# Patient Record
Sex: Male | Born: 1970
Health system: Southern US, Community
[De-identification: ages and names within clinical notes are randomized; demographics above are authoritative.]

## PROBLEM LIST (undated history)

## (undated) DIAGNOSIS — F419 Anxiety disorder, unspecified: Secondary | ICD-10-CM

## (undated) DIAGNOSIS — N261 Atrophy of kidney (terminal): Secondary | ICD-10-CM

## (undated) HISTORY — DX: Anxiety disorder, unspecified: F41.9

## (undated) HISTORY — DX: Atrophy of kidney (terminal): N26.1

---

## 1990-04-12 HISTORY — PX: TONSILECTOMY, ADENOIDECTOMY, BILATERAL MYRINGOTOMY AND TUBES: SHX2538

## 1990-04-12 HISTORY — PX: TONSILLECTOMY: SUR1361

## 2007-09-19 ENCOUNTER — Ambulatory Visit: Payer: Self-pay | Admitting: Family Medicine

## 2007-09-19 DIAGNOSIS — R030 Elevated blood-pressure reading, without diagnosis of hypertension: Secondary | ICD-10-CM

## 2007-09-20 ENCOUNTER — Encounter: Payer: Self-pay | Admitting: Family Medicine

## 2007-09-20 LAB — CONVERTED CEMR LAB
Albumin: 4.8 g/dL (ref 3.5–5.2)
Alkaline Phosphatase: 95 units/L (ref 39–117)
CO2: 24 meq/L (ref 19–32)
Calcium: 9.4 mg/dL (ref 8.4–10.5)
Chloride: 103 meq/L (ref 96–112)
Cholesterol: 246 mg/dL — ABNORMAL HIGH (ref 0–200)
Glucose, Bld: 81 mg/dL (ref 70–99)
Potassium: 4.8 meq/L (ref 3.5–5.3)
Sodium: 139 meq/L (ref 135–145)
Total Protein: 7.3 g/dL (ref 6.0–8.3)
Triglycerides: 661 mg/dL — ABNORMAL HIGH (ref <150)

## 2007-12-21 ENCOUNTER — Ambulatory Visit: Payer: Self-pay | Admitting: Family Medicine

## 2007-12-21 DIAGNOSIS — E781 Pure hyperglyceridemia: Secondary | ICD-10-CM

## 2007-12-21 DIAGNOSIS — E7849 Other hyperlipidemia: Secondary | ICD-10-CM | POA: Insufficient documentation

## 2007-12-26 ENCOUNTER — Telehealth (INDEPENDENT_AMBULATORY_CARE_PROVIDER_SITE_OTHER): Payer: Self-pay | Admitting: *Deleted

## 2007-12-26 ENCOUNTER — Encounter: Payer: Self-pay | Admitting: Family Medicine

## 2007-12-26 LAB — CONVERTED CEMR LAB
Cholesterol: 296 mg/dL — ABNORMAL HIGH (ref 0–200)
Triglycerides: 534 mg/dL — ABNORMAL HIGH (ref <150)

## 2009-12-04 ENCOUNTER — Ambulatory Visit: Payer: Self-pay | Admitting: Family Medicine

## 2009-12-04 DIAGNOSIS — F329 Major depressive disorder, single episode, unspecified: Secondary | ICD-10-CM | POA: Insufficient documentation

## 2009-12-04 DIAGNOSIS — G4700 Insomnia, unspecified: Secondary | ICD-10-CM

## 2009-12-17 ENCOUNTER — Ambulatory Visit (HOSPITAL_COMMUNITY): Payer: Self-pay | Admitting: Psychology

## 2009-12-25 ENCOUNTER — Ambulatory Visit: Payer: Self-pay | Admitting: Family Medicine

## 2010-01-07 ENCOUNTER — Ambulatory Visit (HOSPITAL_COMMUNITY): Payer: Self-pay | Admitting: Psychology

## 2010-01-27 ENCOUNTER — Ambulatory Visit (HOSPITAL_COMMUNITY): Payer: Self-pay | Admitting: Psychology

## 2010-02-02 ENCOUNTER — Ambulatory Visit (HOSPITAL_COMMUNITY): Payer: Self-pay | Admitting: Psychology

## 2010-02-09 ENCOUNTER — Ambulatory Visit (HOSPITAL_COMMUNITY): Payer: Self-pay | Admitting: Psychology

## 2010-02-23 ENCOUNTER — Ambulatory Visit (HOSPITAL_COMMUNITY): Payer: Self-pay | Admitting: Psychology

## 2010-03-11 ENCOUNTER — Encounter: Payer: Self-pay | Admitting: Family Medicine

## 2010-04-02 ENCOUNTER — Ambulatory Visit: Payer: Self-pay | Admitting: Family Medicine

## 2010-04-02 DIAGNOSIS — C44319 Basal cell carcinoma of skin of other parts of face: Secondary | ICD-10-CM | POA: Insufficient documentation

## 2010-04-08 ENCOUNTER — Ambulatory Visit: Payer: Self-pay | Admitting: Family Medicine

## 2010-04-08 ENCOUNTER — Telehealth: Payer: Self-pay | Admitting: Family Medicine

## 2010-04-30 ENCOUNTER — Telehealth: Payer: Self-pay | Admitting: Family Medicine

## 2010-05-08 ENCOUNTER — Telehealth: Payer: Self-pay | Admitting: Family Medicine

## 2010-05-12 NOTE — Assessment & Plan Note (Signed)
Summary: anxiety, insomnia   Vital Signs:  Patient profile:   40 year old male Height:      71.2 inches Weight:      187 pounds BMI:     26.03 Pulse rate:   104 / minute BP sitting:   155 / 88  (left arm) Cuff size:   regular  Vitals Entered By: Avon Gully CMA, Duncan Dull) (December 04, 2009 1:42 PM) CC: anxiety,pt has had trouble sleeping at night, under a lot of stress several situations in his life causing him stress   Primary Care Dmoni Fortson:  Nani Gasser MD  CC:  anxiety, pt has had trouble sleeping at night, and under a lot of stress several situations in his life causing him stress.  History of Present Illness: anxiety,pt has had trouble sleeping at night, under a lot of stress several situations in his life causing him stress.  Has notices has been shaking. His son is in jail and his wife if Bipolar.  He works night shift and hasn't been sleeping well.  Only gets about 4 hours of sleep at night. He worries at night when tries to go to sleep.  Has never felt this anxious.  Never taken medications for his mood. No suicidal thoughts.   Left thumb pain when he hits is mildy.  Painful when pulls the thumb back or pushes down on it. No swlling. only painful if bumps it.  No alleviating sxs.  No redness. No prior injuries.  Can do all his regular activities without pain.    Current Medications (verified): 1)  None  Allergies (verified): 1)  ! * Pollen  Comments:  Nurse/Medical Assistant: The patient's medications and allergies were reviewed with the patient and were updated in the Medication and Allergy Lists. Avon Gully CMA, Duncan Dull) (December 04, 2009 1:46 PM)  Physical Exam  General:  Well-developed,well-nourished,in no acute distress; alert,appropriate and cooperative throughout examination Msk:  Left thum w/ NROM, strength 5/5 in all direction. Nontender and no joint swelling or redness.    Impression & Recommendations:  Problem # 1:  GENERALIZED ANXIETY  DISORDER (ICD-300.02) GAD-7 score is 12 (Moderate) PHQ-9 score is 11 (moderate) Dsicussed tx with therapy and Medications. He was open to both. Will refer to [facility] and start and SSRI. Discussed risk, benefits,ad npotential SE including inc risk of suicide. Pt to call if ay concernes. F/U in 3 weeks to repeat questionnaires adn adjust med.  His updated medication list for this problem includes:    Citalopram Hydrobromide 20 Mg Tabs (Citalopram hydrobromide) .Marland Kitchen... 1/2 tab by mouth daily for one week, then increase to whole tab daily  Orders: Psychology Referral (Psychology)  Problem # 2:  INSOMNIA (ICD-780.52) Trial fo SSRI first, as I do think this is directly related to his mood. F/U in 3 weeks. If still having insomnia can discussed pharmcologi and non pharm tx.  Complete Medication List: 1)  Citalopram Hydrobromide 20 Mg Tabs (Citalopram hydrobromide) .... 1/2 tab by mouth daily for one week, then increase to whole tab daily  Patient Instructions: 1)  Will start citalopram  daily.  2)  Please schedule a follow-up appointment in 3 weeks for anxiety and sleep.  3)  We will make a referral to [facility].  Prescriptions: CITALOPRAM HYDROBROMIDE 20 MG TABS (CITALOPRAM HYDROBROMIDE) 1/2 tab by mouth daily for one week, then increase to whole tab daily  #30 x 0   Entered and Authorized by:   Nani Gasser MD   Signed by:  Nani Gasser MD on 12/04/2009   Method used:   Electronically to        [facility]* (retail)       [address] [place], [place]  [address]       Ph: [phone]       Fax: [phone]   RxID:   [id number]

## 2010-05-12 NOTE — Assessment & Plan Note (Signed)
Summary: 3 WEEK FU Anxiety and Insomnia   Vital Signs:  Patient profile:   40 year old male Height:      71.2 inches Weight:      186 pounds Pulse rate:   85 / minute BP sitting:   143 / 89  (right arm) Cuff size:   regular  Vitals Entered By: Avon Gully CMA, Duncan Dull) (December 25, 2009 9:32 AM) CC: f/u mood, med not helping at all, felt worse, could not sleep on meds   Primary Care Provider:  Nani Gasser MD  CC:  f/u mood, med not helping at all, felt worse, and could not sleep on meds.  History of Present Illness: f/u mood, med not helping at all, felt worse, could not sleep on meds  anxiety,pt has had trouble sleeping at night, under a lot of stress several situations in his life causing him stress.  Has notices has been shaking. His son is in jail and his wife if Bipolar.  He works night shift and hasn't been sleeping well.  Only gets about 4 hours of sleep at night. He worries at night when tries to go to sleep.  Has never felt this anxious. Had tried lorazepam after the death of a family member.   No suicidal thoughts.   Left thumb pain when he hits is mildy.  Painful when pulls the thumb back or pushes down on it. No swlling. only painful if bumps it.  No alleviating sxs.  No redness. No prior injuries.  Can do all his regular activities without pain.    Current Medications (verified): 1)  Citalopram Hydrobromide 20 Mg Tabs (Citalopram Hydrobromide) .... 1/2 Tab By Mouth Daily For One Week, Then Increase To Whole Tab Daily  Allergies (verified): 1)  ! * Pollen   Impression & Recommendations:  Problem # 1:  GENERALIZED ANXIETY DISORDER (ICD-300.02) Did see Olegario Messier last week and thought it was helpful. HAs a f/u appt next week. Encouraged him ot f/u. GAD-7 12 adn PHQ-9 score is 13.   I am dissapointed the citalopram didn't help. Will change to zoloft and have him f/u in one month.  His updated medication list for this problem includes:    Sertraline Hcl 50 Mg  Tabs (Sertraline hcl) ..... Start with 1/2 a tab daily then increase whole pill. taking in am.  Problem # 2:  INSOMNIA (ICD-780.52) Works 3rd shift. Will start with trial of trazdone. Start wth half a tab and can increase up to 2 tabs if needed. Discussed other sleep meds and thier potential SE of sedation and grogginess. Needs to make sure gets a full 8 hours of sleep. If he doesn't notice improvement in the next 2 weeks can call and I will change to [place]. Dsicussed potential for dependence with that medication.   Complete Medication List: 1)  Sertraline Hcl 50 Mg Tabs (Sertraline hcl) .... Start with 1/2 a tab daily then increase whole pill. takin in am. 2)  Trazodone Hcl 50 Mg Tabs (Trazodone hcl) .... Take 1-2 pills at bedtime for insomnia   Patient Instructions: 1)  Please schedule a follow-up appointment in 1 month for mood and insomnia.  Prescriptions: TRAZODONE HCL 50 MG TABS (TRAZODONE HCL) Take 1-2 pills at bedtime for insomnia  #60 x 0   Entered and Authorized by:   Nani Gasser MD   Signed by:   Nani Gasser MD on 12/25/2009   Method used:   Electronically to        [facility] (retail)       [address] [place], [place]  [address]       Ph: [phone]       Fax: [phone]   RxID:   [id number] SERTRALINE HCL 50 MG TABS (SERTRALINE HCL) Start with 1/2 a tab daily then increase whole pill. Takin in AM.  #30 x 0   Entered and Authorized by:   Nani Gasser MD   Signed by:   Nani Gasser MD on 12/25/2009   Method used:   Electronically to        [facility]* (retail)       [address] [place], [place]  [address]       Ph: [phone]       Fax: [phone]   RxID:   [id number]

## 2010-05-12 NOTE — Progress Notes (Signed)
Summary: [name]  [name]   Imported By: Sherian Rein 01/02/2010 12:18:10  _____________________________________________________________________  External Attachment:    Type:   Image     Comment:   External Document

## 2010-05-12 NOTE — Letter (Signed)
Summary: Depression & Anxiety Questionnaires  Depression & Anxiety Questionnaires   Imported By: Lanelle Bal 12/23/2009 10:31:22  _____________________________________________________________________  External Attachment:    Type:   Image     Comment:   External Document

## 2010-05-12 NOTE — Progress Notes (Signed)
Summary: GAD-7/[facility] Kathryne Sharper  GAD-7/ Kathryne Sharper   Imported By: Sherian Rein 01/02/2010 12:19:14  _____________________________________________________________________  External Attachment:    Type:   Image     Comment:   External Document

## 2010-05-14 ENCOUNTER — Ambulatory Visit (INDEPENDENT_AMBULATORY_CARE_PROVIDER_SITE_OTHER): Payer: BC Managed Care – PPO | Admitting: Family Medicine

## 2010-05-14 ENCOUNTER — Encounter: Payer: Self-pay | Admitting: Family Medicine

## 2010-05-14 DIAGNOSIS — R599 Enlarged lymph nodes, unspecified: Secondary | ICD-10-CM | POA: Insufficient documentation

## 2010-05-14 NOTE — Medication Information (Signed)
Summary: Nonadherence with Sertraline & Citalopram/[facility]  Nonadherence with Sertraline & Citalopram/[facility]   Imported By: Lanelle Bal 04/08/2010 12:48:08  _____________________________________________________________________  External Attachment:    Type:   Image     Comment:   External Document

## 2010-05-14 NOTE — Progress Notes (Signed)
Summary: NEED PHARMACY CHANGED  Phone Note Call from Patient   Caller: Spouse Summary of Call: PT'S SPOUSE REQ TO HAVE PHARMACY CHANGED TO [facility] IN [place] INSTEAD OF [facility] Initial call taken by: Janeal Holmes,  April 08, 2010 10:31 AM  Follow-up for Phone Call        done already Follow-up by: Avon Gully CMA, Duncan Dull),  April 08, 2010 11:01 AM

## 2010-05-14 NOTE — Assessment & Plan Note (Signed)
Summary: atypical nevus   Vital Signs:  Patient profile:   40 year old male Height:      71.2 inches Weight:      182 pounds BMI:     25.33 O2 Sat:      94 % on Room air Temp:     98.3 degrees F oral Pulse rate:   118 / minute BP sitting:   134 / 84  (left arm) Cuff size:   regular  Vitals Entered By: Payton Spark CMA (April 02, 2010 10:12 AM)  O2 Flow:  Room air CC: Scab on R side of nose x months. Worried about skin cancer   Primary Care Provider:  Nani Gasser MD  CC:  Scab on R side of nose x months. Worried about skin cancer.  History of Present Illness: 40 yo WM presents for a worrisome spot on R side of nose present  ~3 mos.  It has scabbed and fallen off but come back.  Not bleeding or oozing.  Does not hurt.    Concerned b/c dad had skin cancer. Has a lot of facial sun exposure from fishing.  Current Medications (verified): 1)  None  Allergies (verified): 1)  ! * Pollen  Physical Exam  Skin:  0.3 cm round raised papular lesion R side of nose with tiny scab on top   Impression & Recommendations:  Problem # 1:  NEVUS, ATYPICAL (ICD-216.9) Reid will return next week for a shave excision of this skin lesion that could be a cancer or precancer given the fact that it is a 'non healing' scab x 3 mos now in a  highly sun exposed are + fam hx of skin cancer.  He is agreeable to come back for this.   Orders Added: 1)  Est. Patient Level II [04540]

## 2010-05-14 NOTE — Progress Notes (Signed)
Summary: Derm referral  Phone Note Call from Patient Call back at Home Phone [phone] Call back at cell [phone]   Caller: wife-Crystal Call For: Nani Gasser MD Reason for Call: Referral Summary of Call: PC from pt's wife requesting name of dermatologist other than [facility].  Because pt missed appt they will not reschedule him.  I called their office to see if they would reschedule as pt may not have gotten our message, but their office is closed.  Do you have another preference? Initial call taken by: Francee Piccolo CMA Duncan Dull),  May 08, 2010 1:54 PM  Follow-up for Phone Call        NOt sure off the top of my head. Can try [facility] or [facility]. They may even want to check with their insurance to see who is in network.  Follow-up by: Nani Gasser MD,  May 08, 2010 2:48 PM  Additional Follow-up for Phone Call Additional follow up Details #1::        RC to pt's wife.  she states they do not want to go to [facility].  Pt's wife will make appt in [place].  She will get recommendation form her doctor's office.  Advised Crystal to call our office with appt time and date and location and we can forward notes/labs to hthat physician.  She is agreeable.  I will flag myself to follow up on this on 1/31. Additional Follow-up by: Francee Piccolo CMA Duncan Dull),  May 08, 2010 5:48 PM

## 2010-05-14 NOTE — Progress Notes (Signed)
Summary: Pathology results  Phone Note Call from Patient Call back at [phone]   Caller: Patient Reason for Call: Lab or Test Results Summary of Call: Pt is requesting results of pathology from 04/08/10.  Pt states on voicemail that he has never been notified of these results. Initial call taken by: Francee Piccolo CMA Duncan Dull),  April 30, 2010 1:43 PM  Follow-up for Phone Call        Chart says he was called on 12/30 and we scheduled hime to see Derm on 04-16-2010. Maybe we don't have the correct number.  Follow-up by: Nani Gasser MD,  April 30, 2010 1:52 PM  Additional Follow-up for Phone Call Additional follow up Details #1::        called and spoke to the wife. wife states they were out of town and perhaps their daughter erased message if a message was left on the vm. Notified wife of results and gave them the number to [facility] which is where referral was sent Additional Follow-up by: Avon Gully CMA, Duncan Dull),  April 30, 2010 1:59 PM

## 2010-05-14 NOTE — Assessment & Plan Note (Signed)
Summary: bx of lesion on nose   Vital Signs:  Patient profile:   40 year old male Height:      71.2 inches Weight:      184 pounds Pulse rate:   81 / minute BP sitting:   147 / 92  (right arm) Cuff size:   regular  Vitals Entered By: Avon Gully CMA, Duncan Dull) (April 08, 2010 10:35 AM) CC: bx on the nose and back,lesion on the back has changed colors   Primary Care Kumari Sculley:  Nani Gasser MD  CC:  bx on the nose and back and lesion on the back has changed colors.  History of Present Illness: bx on the nose and back,lesion on the back has changed colors.  I reviewed prior note from Dr. B. Lesion  x 3 months and keeps scabbing over. No drianage or redness. Doesn't ever wear sunscreen. Also a lesion on his badk that has changed colors. Neither are tender or painful.   Current Medications (verified): 1)  None  Allergies (verified): 1)  ! * Pollen  Comments:  Nurse/Medical Assistant: The patient's medications and allergies were reviewed with the patient and were updated in the Medication and Allergy Lists. Avon Gully CMA, Duncan Dull) (April 08, 2010 10:36 AM)  Physical Exam  General:  Well-developed,well-nourished,in no acute distress; alert,appropriate and cooperative throughout examination Skin:  Skin tag appears normal on right upper back Does have a small lesion on right side of nose with pinpoint scab. No erythema.     Impression & Recommendations:  Problem # 1:  NEVUS, ATYPICAL (ICD-216.9)  Area biopsied and pt tolerated well. Will call with pathology report in one week. Based on exam I am most suspicious of basal cell cancer.   Orders: Shave Skin Lesion < 0.5cm face/ears/eyelids/nose/lips/mm (11310)  Patient Instructions: 1)  We will call you with the biopsy results in about a week.    Orders Added: 1)  Est. Patient Level II [30865] 2)  Shave Skin Lesion < 0.5cm face/ears/eyelids/nose/lips/mm [11310]    Procedure Note Last Tetanus:  Historical (04/13/2003)  Biopsy: Biopsy Type: Skin Onset of lesion: 2 months Indication: suspicious lesion  Procedure # 1: shave biopsy    Size (in cm): 0.2 x 0.2    Region: right side of nose    Anesthesia: 1% lidocaine w/o epinephrine  Cleaned and prepped with: alcohol and betadine Wound dressing: bandaid

## 2010-05-19 LAB — CONVERTED CEMR LAB
Basophils Relative: 2 % — ABNORMAL HIGH (ref 0–1)
Eosinophils Absolute: 0.2 10*3/uL (ref 0.0–0.7)
MCHC: 33.7 g/dL (ref 30.0–36.0)
MCV: 89 fL (ref 78.0–100.0)
Monocytes Absolute: 0.2 10*3/uL (ref 0.1–1.0)
Monocytes Relative: 2 % — ABNORMAL LOW (ref 3–12)
Neutrophils Relative %: 17 % — ABNORMAL LOW (ref 43–77)
RBC: 5.01 M/uL (ref 4.22–5.81)

## 2010-05-20 NOTE — Assessment & Plan Note (Signed)
Summary: ? Swollen Lymphnode   Vital Signs:  Patient profile:   40 year old male Height:      71.2 inches Weight:      182 pounds Temp:     98.4 degrees F oral Pulse rate:   84 / minute BP sitting:   137 / 84  (right arm) Cuff size:   regular  Vitals Entered By: Avon Gully CMA, (AAMA) (May 14, 2010 10:00 AM) CC: swollen lymphnode on the left neck since thurs   Primary Care Provider:  Nani Gasser MD  CC:  swollen lymphnode on the left neck since thurs.  History of Present Illness: Says felt like the flu biut without th ecold sxs. Just had aches and fatigue for several days and low grade temp. NO cough, ST, ear pain, or sinus sxs.  Wife and daugher had URI sxs. Wife had strep.  He denies ST or drainge.  Hx of tonsillectomy. Now feels better overall but LN is still swollen. Denies any swollen glands in teh axilla or the groin.   Current Medications (verified): 1)  None  Allergies (verified): 1)  ! * Pollen  Comments:  Nurse/Medical Assistant: The patient's medications and allergies were reviewed with the patient and were updated in the Medication and Allergy Lists. Avon Gully CMA, Duncan Dull) (May 14, 2010 10:01 AM)  Physical Exam  General:  Well-developed,well-nourished,in no acute distress; alert,appropriate and cooperative throughout examination Head:  Normocephalic and atraumatic without obvious abnormalities. No apparent alopecia or balding. Eyes:  No corneal or conjunctival inflammation noted. EOMI. Perrla. Ears:  External ear exam shows no significant lesions or deformities.  Otoscopic examination reveals clear canals, tympanic membranes are intact bilaterally without bulging, retraction, inflammation or discharge. Hearing is grossly normal bilaterally. Nose:  External nasal examination shows no deformity or inflammation. Nasal mucosa are pink and moist without lesions or exudates. Mouth:  Oral mucosa and oropharynx without lesions or exudates.   Teeth in good repair. Neck:  Right anteriori neck with 1.5 to 2 cm lymph nodes that is very tender. No erythema. It is not rock hard.  Lungs:  Normal respiratory effort, chest expands symmetrically. Lungs are clear to auscultation, no crackles or wheezes. Heart:  Normal rate and regular rhythm. S1 and S2 normal without gallop, murmur, click, rub or other extra sounds.   Impression & Recommendations:  Problem # 1:  CERVICAL LYMPHADENOPATHY (ICD-785.6) Assessment New Will getCBC but likely viral illness. Call if LN doesn't resolve in 3 weeks. Avoid rubbing it.  Orders: T-CBC w/Diff [phone])   Orders Added: 1)  T-CBC w/Diff [[id number]] 2)  Est. Patient Level III [30160]

## 2010-06-08 ENCOUNTER — Telehealth: Payer: Self-pay | Admitting: Family Medicine

## 2010-06-10 ENCOUNTER — Encounter: Payer: Self-pay | Admitting: Family Medicine

## 2010-06-10 ENCOUNTER — Ambulatory Visit (INDEPENDENT_AMBULATORY_CARE_PROVIDER_SITE_OTHER): Payer: BC Managed Care – PPO | Admitting: Family Medicine

## 2010-06-10 LAB — CONVERTED CEMR LAB
Basophils Absolute: 0.1 10*3/uL (ref 0.0–0.1)
Basophils Relative: 1 % (ref 0–1)
MCHC: 35.4 g/dL (ref 30.0–36.0)
Neutro Abs: 5.1 10*3/uL (ref 1.7–7.7)
Neutrophils Relative %: 61 % (ref 43–77)
Platelets: 189 10*3/uL (ref 150–400)
RBC: 4.95 M/uL (ref 4.22–5.81)
RDW: 11.8 % (ref 11.5–15.5)

## 2010-06-12 LAB — CONVERTED CEMR LAB
CMV IgM: 0.09 (ref <0.90)
EBV VCA IgG: 1.63 — ABNORMAL HIGH

## 2010-06-18 NOTE — Assessment & Plan Note (Signed)
Summary: F/U enlarged LN.    Vital Signs:  Patient profile:   40 year old male Height:      71.2 inches Weight:      185 pounds Pulse rate:   96 / minute BP sitting:   145 / 85  (right arm) Cuff size:   regular  Vitals Entered By: Avon Gully CMA, Duncan Dull) (June 10, 2010 8:13 AM) CC: f/u Bld work and recheck lymphnodes   Primary Care Provider:  Nani Gasser MD  CC:  f/u Bld work and recheck lymphnodes.  History of Present Illness: f/u Bld work and recheck lymphnodes. Feel the LN is still swollen.  Says occ will feel a little smaller and then will get bigger agian.  Has had a couple of episodes of brief nausea. Lasted a cuople of minutes. No vomiting.  Was recently exposed to mono.  Does feel very fatigued. No fever, aches or chills but did have this initally. Has had enlarged node for about 7 weeks now.   Current Medications (verified): 1)  None  Allergies (verified): 1)  ! * Pollen  Comments:  Nurse/Medical Assistant: The patient's medications and allergies were reviewed with the patient and were updated in the Medication and Allergy Lists. Avon Gully CMA, Duncan Dull) (June 10, 2010 8:14 AM)  Physical Exam  General:  Well-developed,well-nourished,in no acute distress; alert,appropriate and cooperative throughout examination Lungs:  Normal respiratory effort, chest expands symmetrically. Lungs are clear to auscultation, no crackles or wheezes. Heart:  Normal rate and regular rhythm. S1 and S2 normal without gallop, murmur, click, rub or other extra sounds. Cervical Nodes:  Approx 1 x 2cm LN right ant cerv. NOn tender and no erythema.  Left ant cerv ls palpable but much smaller.    Impression & Recommendations:  Problem # 1:  CERVICAL LYMPHADENOPATHY (ICD-785.6) Discussed will recheck labs. Will also check for CMV and EBV wince had a positive exposure. If all normal then will refer fo possible r bx with general surgery.  Orders: T-CBC w/Diff  [phone]) T-CMV IgM  Antibody ([id number]) T-Epstein Barr Virus Antibody Panel I ([id number]) T-Epstein-Barr virus, viral capsid ([id number])  Patient Instructions: 1)  WE WILL CALL YOU WITH YOUR LAB RESULTS.  2)  iF LABS ARE NORMAL WE WILL REFER YOU TO SURGERY FOR POSSIBLE BIOPSY OF THE LYMPH NODE   Orders Added: 1)  T-CBC w/Diff [[id number]] 2)  T-CMV IgM  Antibody [[id number]] 3)  T-Epstein Barr Virus Antibody Panel I [[id number]] 4)  T-Epstein-Barr virus, viral capsid [id number]

## 2010-06-18 NOTE — Progress Notes (Signed)
Summary: [facility]-Lab work  Phone Note Call from Patient Call back at [phone]   Caller: Wife-Crystal Summary of Call: Wife left message stating pt needs order faxed to lab for CBC.  I left Crystal a message to return call--pt needs labs and ROV. Initial call taken by: Francee Piccolo CMA Duncan Dull),  June 08, 2010 1:34 PM  Follow-up for Phone Call        Spoke to wife regarding the above.  She is agreeable with appt.  Pt will come on Wednesday at 8am.  Pt will have labs at that time as they want to discuss exposure to mono around time symptoms began. Follow-up by: Francee Piccolo CMA Duncan Dull),  June 08, 2010 2:15 PM

## 2010-09-09 ENCOUNTER — Encounter: Payer: Self-pay | Admitting: Family Medicine

## 2010-09-09 ENCOUNTER — Ambulatory Visit (INDEPENDENT_AMBULATORY_CARE_PROVIDER_SITE_OTHER): Payer: BC Managed Care – PPO | Admitting: Family Medicine

## 2010-09-09 VITALS — BP 147/92 | HR 51 | Ht 71.0 in | Wt 185.0 lb

## 2010-09-09 DIAGNOSIS — F411 Generalized anxiety disorder: Secondary | ICD-10-CM

## 2010-09-09 DIAGNOSIS — C44319 Basal cell carcinoma of skin of other parts of face: Secondary | ICD-10-CM

## 2010-09-09 DIAGNOSIS — C44311 Basal cell carcinoma of skin of nose: Secondary | ICD-10-CM

## 2010-09-09 MED ORDER — FLUOXETINE HCL 20 MG PO CAPS: 20.0000 mg | ORAL_CAPSULE | Freq: Every day | ORAL | Status: DC

## 2010-09-09 NOTE — Assessment & Plan Note (Signed)
Will refer to derma gain. Really needs to be treated.

## 2010-09-09 NOTE — Progress Notes (Signed)
  Subjective:    Patient ID: Anthony Morgan, male    DOB: [birth year], 40 y.o.   MRN: [id number]  HPI He is separating from his wife.  He is having difficulty feeling very nervous.  Having a hard time falling asleep but can stay asleep.  Feels it is affecting him daily. Occ feels sad and depressed.  Has been married for 19 years.  Has a duaghter who is 14.  He moved out last weekend. He took one of his moms lorazepam and that helped. Tried citalopram in the past and that worsened his mood and sleep. Was seeing kathy at the time.  Started the sertraline but then had nasal congestion so stopped it.    Missed his appt with derm to have his basal cell treated.  Now the derm office won't see him.  Needs new referral to new office.    Review of Systems     Objective:   Physical Exam  Constitutional: He is oriented to person, place, and time. He appears well-developed and well-nourished.  HENT:  Head: Normocephalic and atraumatic.  Neurological: He is alert and oriented to person, place, and time.  Psychiatric: He has a normal mood and affect. His behavior is normal.          Assessment & Plan:

## 2010-09-09 NOTE — Assessment & Plan Note (Addendum)
I discussed that I really think he would benefit from counseling in the past.  He saw Olegario Messier last year and felt it was helpful. Will start prozac, F/U in 3 weeks. If still not sleeping consider low dose xanax for prn use.  PHQ-9 score or 9, GAD-7 score of 9.

## 2010-09-14 ENCOUNTER — Encounter: Payer: Self-pay | Admitting: *Deleted

## 2010-09-14 ENCOUNTER — Encounter: Payer: Self-pay | Admitting: Family Medicine

## 2010-09-14 ENCOUNTER — Ambulatory Visit (INDEPENDENT_AMBULATORY_CARE_PROVIDER_SITE_OTHER): Payer: BC Managed Care – PPO | Admitting: Family Medicine

## 2010-09-14 VITALS — BP 146/97 | HR 70 | Wt 184.0 lb

## 2010-09-14 DIAGNOSIS — F411 Generalized anxiety disorder: Secondary | ICD-10-CM

## 2010-09-14 MED ORDER — MUPIROCIN 2 % EX OINT: TOPICAL_OINTMENT | Freq: Three times a day (TID) | CUTANEOUS | Status: AC

## 2010-09-14 MED ORDER — SERTRALINE HCL 50 MG PO TABS: 50.0000 mg | ORAL_TABLET | Freq: Every day | ORAL | Status: DC

## 2010-09-14 NOTE — Progress Notes (Signed)
  Subjective:    Patient ID: Anthony Morgan, male    DOB: [birth year], 40 y.o.   MRN: [id number]  HPI Sat after ate felt like ne hedles where sticking in his stomach about 5-6 hours after took it. Then next night had abdominal pain and cramping and diarrhea.  Started it about 1 weeks ago.  He hasn't taken any today.    Tick bite on his right flank. He had to detach it. Now has a rash around the area. It itched for about 2 weeks. They have been itchy. Has been putting hydrocortisone on it.  No fever or arhtralgias. Has 2 lesion on his right side, one on the left side and one on each foot.  No drainage or fever.    Review of Systems     Objective:   Physical Exam  Constitutional: He is oriented to person, place, and time. He appears well-developed and well-nourished.  HENT:  Head: Normocephalic and atraumatic.  Cardiovascular: Normal rate, regular rhythm, normal heart sounds and intact distal pulses.   Pulmonary/Chest: Effort normal and breath sounds normal.  Abdominal: Soft. Bowel sounds are normal. He exhibits no distension and no mass. There is no tenderness. There is no rebound and no guarding.  Neurological: He is alert and oriented to person, place, and time.  Skin: Skin is warm and dry.  Psychiatric: He has a normal mood and affect.          Assessment & Plan:

## 2010-09-14 NOTE — Assessment & Plan Note (Signed)
Will try the sertraline again.  I am not certain the fluoxetine really cause the GI upset but it is listed as a SE. Will change. Call if not dong well.  Consider Viibryd with the coupon card if feels he gets nasal congestion again.  F/U in 3-4 weeks.

## 2011-03-12 ENCOUNTER — Ambulatory Visit (INDEPENDENT_AMBULATORY_CARE_PROVIDER_SITE_OTHER): Payer: BC Managed Care – PPO | Admitting: Family Medicine

## 2011-03-12 DIAGNOSIS — R509 Fever, unspecified: Secondary | ICD-10-CM

## 2011-03-12 DIAGNOSIS — Z23 Encounter for immunization: Secondary | ICD-10-CM

## 2011-03-12 DIAGNOSIS — J329 Chronic sinusitis, unspecified: Secondary | ICD-10-CM

## 2011-03-12 LAB — POCT INFLUENZA A/B: Influenza B, POC: NEGATIVE

## 2011-03-12 MED ORDER — AMOXICILLIN-POT CLAVULANATE 875-125 MG PO TABS: 1.0000 | ORAL_TABLET | Freq: Two times a day (BID) | ORAL | Status: AC

## 2011-03-12 NOTE — Progress Notes (Signed)
  Subjective:    Patient ID: Anthony Morgan, male    DOB: January 31, 1971, 40 y.o.   MRN: [id number]  HPI 4 days ago started with head congestion and sneezing. Then yesterday felt a little better and then went to bed and woke up with fever and chills. Fever to 100.?  No GI sxs.  Mild scratchy throat. No ear pain or pressure. + sick contact last week.  Woke up with a lot fo sinus pressure and swelling on the left side.    Review of Systems     Objective:   Physical Exam  Constitutional: He is oriented to person, place, and time. He appears well-developed and well-nourished.  HENT:  Head: Normocephalic and atraumatic.  Right Ear: External ear normal.  Left Ear: External ear normal.  Nose: Nose normal.  Mouth/Throat: Oropharynx is clear and moist.       TMs and canals are clear. Mild swelling over the left maxillary sinus. No erythema.   Eyes: Conjunctivae and EOM are normal. Pupils are equal, round, and reactive to light.  Neck: Neck supple. No thyromegaly present.  Cardiovascular: Normal rate and normal heart sounds.   Pulmonary/Chest: Effort normal and breath sounds normal.  Lymphadenopathy:    He has no cervical adenopathy.  Neurological: He is alert and oriented to person, place, and time.  Skin: Skin is warm and dry.  Psychiatric: He has a normal mood and affect.          Assessment & Plan:  Sinusitis on the left maxillary - Flu test is neg.  Fever 3 days after illness started with left facial pain and swelling. Will tx with augementin. Need not for work. Work on hydration.  Call if not better in one week.   Flu vaccine given today.

## 2011-03-12 NOTE — Patient Instructions (Signed)
Call if not better in one week. Sinusitis Sinuses are air pockets within the bones of your face. The growth of bacteria within a sinus leads to infection. The infection prevents the sinuses from draining. This infection is called sinusitis. SYMPTOMS   There will be different areas of pain depending on which sinuses have become infected.  The maxillary sinuses often produce pain beneath the eyes.     Frontal sinusitis may cause pain in the middle of the forehead and above the eyes.  Other problems (symptoms) include:  Toothaches.     Colored, pus-like (purulent) drainage from the nose.     Swelling, warmth, and tenderness over the sinus areas may be signs of infection.  TREATMENT   Sinusitis is most often determined by an exam.X-rays may be taken. If x-rays have been taken, make sure you obtain your results or find out how you are to obtain them. Your caregiver may give you medications (antibiotics). These are medications that will help kill the bacteria causing the infection. You may also be given a medication (decongestant) that helps to reduce sinus swelling.   HOME CARE INSTRUCTIONS    Only take over-the-counter or prescription medicines for pain, discomfort, or fever as directed by your caregiver.     Drink extra fluids. Fluids help thin the mucus so your sinuses can drain more easily.     Applying either moist heat or ice packs to the sinus areas may help relieve discomfort.     Use saline nasal sprays to help moisten your sinuses. The sprays can be found at your local drugstore.  SEEK IMMEDIATE MEDICAL CARE IF:  You have a fever.     You have increasing pain, severe headaches, or toothache.     You have nausea, vomiting, or drowsiness.     You develop unusual swelling around the face or trouble seeing.  MAKE SURE YOU:    Understand these instructions.     Will watch your condition.     Will get help right away if you are not doing well or get worse.  Document Released:  03/29/2005 Document Revised: 12/09/2010 Document Reviewed: 10/26/2006 [facility] Patient Information 2012 [place], [place].

## 2011-09-01 ENCOUNTER — Encounter: Payer: Self-pay | Admitting: Family Medicine

## 2011-09-01 ENCOUNTER — Ambulatory Visit (INDEPENDENT_AMBULATORY_CARE_PROVIDER_SITE_OTHER): Payer: BC Managed Care – PPO | Admitting: Family Medicine

## 2011-09-01 ENCOUNTER — Other Ambulatory Visit: Payer: Self-pay | Admitting: *Deleted

## 2011-09-01 ENCOUNTER — Telehealth: Payer: Self-pay | Admitting: *Deleted

## 2011-09-01 VITALS — BP 133/77 | HR 74 | Ht 71.2 in | Wt 186.0 lb

## 2011-09-01 DIAGNOSIS — F329 Major depressive disorder, single episode, unspecified: Secondary | ICD-10-CM

## 2011-09-01 DIAGNOSIS — F411 Generalized anxiety disorder: Secondary | ICD-10-CM

## 2011-09-01 DIAGNOSIS — F419 Anxiety disorder, unspecified: Secondary | ICD-10-CM

## 2011-09-01 MED ORDER — SERTRALINE HCL 50 MG PO TABS: ORAL_TABLET | ORAL | Status: DC

## 2011-09-01 MED ORDER — ALPRAZOLAM 0.25 MG PO TABS: 0.2500 mg | ORAL_TABLET | Freq: Every evening | ORAL | Status: DC | PRN

## 2011-09-01 NOTE — Telephone Encounter (Signed)
Pt is requesting an rx to help him sleep.

## 2011-09-01 NOTE — Telephone Encounter (Signed)
Will send over xanax. Use sparingly as can become dependent on it.

## 2011-09-01 NOTE — Patient Instructions (Signed)
We will call you with the referral. 

## 2011-09-01 NOTE — Telephone Encounter (Signed)
Called rx for xanx into pharm as shown in the medication log because it wasn't signed and pt wanted to pick up today

## 2011-09-01 NOTE — Telephone Encounter (Signed)
LMOM
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
## 2011-09-03 ENCOUNTER — Encounter (HOSPITAL_COMMUNITY): Payer: Self-pay | Admitting: Psychiatry

## 2011-09-03 ENCOUNTER — Ambulatory Visit (INDEPENDENT_AMBULATORY_CARE_PROVIDER_SITE_OTHER): Payer: BC Managed Care – PPO | Admitting: Psychiatry

## 2011-09-03 VITALS — BP 112/80 | HR 75 | Ht 70.5 in | Wt 193.0 lb

## 2011-09-03 DIAGNOSIS — F411 Generalized anxiety disorder: Secondary | ICD-10-CM

## 2011-09-03 MED ORDER — SERTRALINE HCL 50 MG PO TABS: ORAL_TABLET | ORAL | Status: DC

## 2011-09-03 MED ORDER — BUSPIRONE HCL 5 MG PO TABS: 5.0000 mg | ORAL_TABLET | Freq: Three times a day (TID) | ORAL | Status: DC | PRN
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
## 2011-09-24 ENCOUNTER — Encounter (HOSPITAL_COMMUNITY): Payer: Self-pay | Admitting: Psychiatry

## 2011-09-24 ENCOUNTER — Ambulatory Visit (INDEPENDENT_AMBULATORY_CARE_PROVIDER_SITE_OTHER): Payer: BC Managed Care – PPO | Admitting: Psychiatry

## 2011-09-24 VITALS — BP 135/95 | HR 115 | Ht 70.5 in | Wt 182.0 lb

## 2011-09-24 DIAGNOSIS — F411 Generalized anxiety disorder: Secondary | ICD-10-CM

## 2011-09-24 DIAGNOSIS — F329 Major depressive disorder, single episode, unspecified: Secondary | ICD-10-CM

## 2011-09-24 MED ORDER — SERTRALINE HCL 100 MG PO TABS: 100.0000 mg | ORAL_TABLET | Freq: Every day | ORAL | Status: DC

## 2011-09-24 MED ORDER — SERTRALINE HCL 100 MG PO TABS: 150.0000 mg | ORAL_TABLET | Freq: Every day | ORAL | Status: DC

## 2011-09-24 MED ORDER — SERTRALINE HCL 100 MG PO TABS
100.0000 mg | ORAL_TABLET | Freq: Every day | ORAL | Status: DC
Start: 2011-09-24 — End: 2011-09-24

## 2011-09-24 MED ORDER — BUSPIRONE HCL 5 MG PO TABS
ORAL_TABLET | ORAL | Status: DC
Start: 2011-09-24 — End: 2011-11-22
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
## 2011-09-28 ENCOUNTER — Encounter (HOSPITAL_COMMUNITY): Payer: Self-pay | Admitting: Psychiatry

## 2011-10-08 ENCOUNTER — Telehealth (HOSPITAL_COMMUNITY): Payer: Self-pay

## 2011-10-08 DIAGNOSIS — G4700 Insomnia, unspecified: Secondary | ICD-10-CM

## 2011-10-08 NOTE — Telephone Encounter (Signed)
Called patient back, left message stating I would call him back on Monday, 10/11/2011.

## 2011-10-11 MED ORDER — RAMELTEON 8 MG PO TABS: 8.0000 mg | ORAL_TABLET | Freq: Every day | ORAL | Status: DC

## 2011-10-11 NOTE — Telephone Encounter (Signed)
The patient reports that he still have difficulty with sleep. He states that he continue to have symptoms of anxiety and depression.  He reports that his insomnia continues to be a problem, sleeping 3-6 hours. The patient reports tbat  PLAN: Will start Rozerem 8 mg

## 2011-10-11 NOTE — Telephone Encounter (Signed)
Called patient. Informed patient I would call again to discuss his sleep issues.

## 2011-10-11 NOTE — Addendum Note (Signed)
Addended by: Larena Sox on: 10/11/2011 01:09 PM   Modules accepted: Orders

## 2011-10-12 ENCOUNTER — Ambulatory Visit (HOSPITAL_COMMUNITY): Payer: Self-pay | Admitting: Psychiatry

## 2011-10-18 NOTE — Telephone Encounter (Signed)
Called patient as he reported he could not sleep. Left a message asking patient to call back if he has any further questions.

## 2011-10-21 ENCOUNTER — Ambulatory Visit (HOSPITAL_COMMUNITY): Payer: Self-pay | Admitting: Psychiatry

## 2011-10-25 ENCOUNTER — Ambulatory Visit (HOSPITAL_COMMUNITY): Payer: Self-pay | Admitting: Psychology

## 2011-11-22 ENCOUNTER — Encounter (HOSPITAL_COMMUNITY): Payer: Self-pay | Admitting: Psychology

## 2011-11-22 ENCOUNTER — Ambulatory Visit (INDEPENDENT_AMBULATORY_CARE_PROVIDER_SITE_OTHER): Payer: BC Managed Care – PPO | Admitting: Psychology

## 2011-11-22 DIAGNOSIS — F329 Major depressive disorder, single episode, unspecified: Secondary | ICD-10-CM

## 2011-11-22 NOTE — Patient Instructions (Addendum)
1- Think about goals you would like to address in therapy and come prepared to discuss at next visit.
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
## 2011-12-06 ENCOUNTER — Ambulatory Visit (HOSPITAL_COMMUNITY): Payer: Self-pay | Admitting: Psychology

## 2011-12-07 ENCOUNTER — Ambulatory Visit (INDEPENDENT_AMBULATORY_CARE_PROVIDER_SITE_OTHER): Payer: BC Managed Care – PPO | Admitting: Psychology

## 2011-12-07 DIAGNOSIS — F411 Generalized anxiety disorder: Secondary | ICD-10-CM

## 2011-12-07 DIAGNOSIS — F329 Major depressive disorder, single episode, unspecified: Secondary | ICD-10-CM

## 2011-12-07 NOTE — Progress Notes (Signed)
   THERAPIST PROGRESS NOTE  Session Time: 807- 850 am  Participation Level: Active  Behavioral Response: NeatAlertEuthymic  Type of Therapy: Individual Therapy  Treatment Goals addressed: Anxiety and Coping  Interventions: Solution Focused, Strength-based, Psychosocial Skills: communication skills, recognizing people that he will permit in his life, coping and Supportive  Summary: Anthony Morgan is a 41 y.o. male who presents as pleasant and easily engaged.  The patient reports he is doing well and that he thinks he is adapting to the separation and not seeing his daughter as often.  He missed the daily contact with his daughter as they are very close but he is much happier being out of his marriage.  The patient has no regrets about leaving his marriage and reports he tried for almost 20 years before finally having enough and leaving.  His 20th anniversary is later this week and he denies having any sadness linked with not achieving this milestone.  He knows that his daughter still wishes that her parents were together but he knows that leaving was best.  The time he has with his daughter now is much more enjoyable.  When they all lived under one roof, a lot of time with the patient was spent talking about how unhappy she was at the way her mother treated her father whom she adores.  The patient notices that he is much more open to talking about his feelings now that he is living with his parents.  He uses his mother as a Science writer and has a few good friends that he relies.  His work life is good and he is looking forward to when the one year date of separation comes so that he can seek a divorce.  He is not yet ready for a serious relationship but is irritated this his estranged wife has not yet signed the separation agreement and they have been apart since May 4th.  He did reconnect with a girl he dated in high school and they went out for dinner and he did enjoy catching up with her.  He  expects that when he does start dating that telling his (adoptive) son will be no issue.  He will tell his daughter at some point but he suspects that it might be a bigger issue for her.  I asked the patient to consider the characteristics he desires in a person that he would permit in his life.  He had a few ideas but I requested that he continue thinking about this and return with more information at next visit  Suicidal/Homicidal: No  Plan: Return again in 2 weeks.  Diagnosis: Axis I: Major depressive disorder; GAD    Axis II: No diagnosis    Salley Scarlet, [facility] 12/07/2011

## 2011-12-07 NOTE — Patient Instructions (Addendum)
1- Continue developing list of characteristics that you WANT and DON'T WANT in a person that you would allow in your life.  Some of the things you already know you WANT are someone who is: honest, faithful, selfless, believes in God/is spiritual, puts husband first, loves their children and can love mine as well, good foundation (not abusive environment or has dealt with their issues)   DON'T WANT: someone with anger issues, someone who lies, someone who lies, someone who doesn't manipulate or lie to gain sympathy.  2- Continue taking care of your emotional self.  3- Use distraction as a way to get your mind off of the stress of the separation and not seeing your daughter as much: fishing, take a ride, talks to his mother or another trusted person., concentrate on what you are doing and dont' let your mind wander.

## 2011-12-09 ENCOUNTER — Encounter (HOSPITAL_COMMUNITY): Payer: Self-pay | Admitting: Psychology

## 2011-12-09 DIAGNOSIS — F411 Generalized anxiety disorder: Secondary | ICD-10-CM | POA: Insufficient documentation

## 2011-12-17 ENCOUNTER — Ambulatory Visit (INDEPENDENT_AMBULATORY_CARE_PROVIDER_SITE_OTHER): Payer: BC Managed Care – PPO | Admitting: Family Medicine

## 2011-12-17 ENCOUNTER — Encounter: Payer: Self-pay | Admitting: Family Medicine

## 2011-12-17 VITALS — BP 121/78 | HR 89 | Temp 97.9°F | Resp 18 | Wt 180.0 lb

## 2011-12-17 DIAGNOSIS — J329 Chronic sinusitis, unspecified: Secondary | ICD-10-CM

## 2011-12-17 DIAGNOSIS — A499 Bacterial infection, unspecified: Secondary | ICD-10-CM

## 2011-12-17 MED ORDER — AMOXICILLIN-POT CLAVULANATE 875-125 MG PO TABS: 1.0000 | ORAL_TABLET | Freq: Two times a day (BID) | ORAL | Status: DC

## 2011-12-17 NOTE — Patient Instructions (Signed)

## 2011-12-17 NOTE — Progress Notes (Signed)
CC: Anthony Morgan is a 41 y.o. male is here for Facial Swelling and Nasal Congestion   Subjective: HPI:  1 a half weeks of facial pressure located just below the eyes that has now progressed to between the eyes. Associated with thick nasal discharge green color without blood. Interventions include Mucinex D without much relief. Facial discomfort is worse when standing up quickly or leaning forward. He denies fevers, chills, neck pain, motor or sensory disturbances, cough, shortness of breath, chest pain.   Review Of Systems Outlined In HPI  Past Medical History  Diagnosis Date  . Anxiety      Family History  Problem Relation Age of Onset  . Hypertension Father   . Hyperlipidemia Father   . Stroke      grandmother  . Cancer      grandfather/bladder ca  . Cancer Mother     breast  . Cancer Maternal Grandmother      History  Substance Use Topics  . Smoking status: Current Everyday Smoker -- 1.0 packs/day for 25 years    Types: Cigarettes  . Smokeless tobacco: Not on file  . Alcohol Use: No     Objective: Filed Vitals:   12/17/11 0920  BP: 121/78  Pulse: 89  Temp: 97.9 F (36.6 C)  Resp: 18    General: Alert and Oriented, No Acute Distress HEENT: Pupils equal, round, reactive to light. Conjunctivae clear.  External ears unremarkable, canals clear with intact TMs with appropriate landmarks.  Middle ear appears open without effusion. Erythematous inferior turbinates with copious purulent mucus.  Moist mucous membranes, pharynx without inflammation nor lesions.  Neck supple without palpable lymphadenopathy nor abnormal masses. Maxillary sinus tenderness bilaterally. Lungs: Clear to auscultation bilaterally, no wheezing/ronchi/rales.  Comfortable work of breathing. Good air movement. Cardiac: Regular rate and rhythm. Normal S1/S2.  No murmurs, rubs, nor gallops.   Skin: Warm and dry.  Assessment & Plan: Anthony Morgan was seen today for facial swelling and nasal  congestion.  Diagnoses and associated orders for this visit:  Bacterial sinusitis - amoxicillin-clavulanate (AUGMENTIN) 875-125 MG per tablet; Take 1 tablet by mouth 2 (two) times daily.  Start Augmentin, symptomatically control with nasal saline washes, Mucinex D, ibuprofen worse he mentioned for discomfort. Call clinic if no better on Monday. Signs and symptoms requring emergent/urgent reevaluation were discussed with the patient.    Return if symptoms worsen or fail to improve.  Requested Prescriptions   Signed Prescriptions Disp Refills  . amoxicillin-clavulanate (AUGMENTIN) 875-125 MG per tablet 20 tablet 0    Sig: Take 1 tablet by mouth 2 (two) times daily.

## 2011-12-21 ENCOUNTER — Encounter (HOSPITAL_COMMUNITY): Payer: Self-pay | Admitting: Psychology

## 2011-12-21 ENCOUNTER — Ambulatory Visit (INDEPENDENT_AMBULATORY_CARE_PROVIDER_SITE_OTHER): Payer: BC Managed Care – PPO | Admitting: Psychology

## 2011-12-21 DIAGNOSIS — F411 Generalized anxiety disorder: Secondary | ICD-10-CM

## 2011-12-21 DIAGNOSIS — F329 Major depressive disorder, single episode, unspecified: Secondary | ICD-10-CM
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
## 2011-12-24 ENCOUNTER — Ambulatory Visit (INDEPENDENT_AMBULATORY_CARE_PROVIDER_SITE_OTHER): Payer: BC Managed Care – PPO | Admitting: Family Medicine

## 2011-12-24 ENCOUNTER — Encounter: Payer: Self-pay | Admitting: Family Medicine

## 2011-12-24 ENCOUNTER — Ambulatory Visit (INDEPENDENT_AMBULATORY_CARE_PROVIDER_SITE_OTHER): Payer: BC Managed Care – PPO

## 2011-12-24 VITALS — BP 137/77 | HR 97 | Wt 183.0 lb

## 2011-12-24 DIAGNOSIS — R51 Headache: Secondary | ICD-10-CM

## 2011-12-24 DIAGNOSIS — Z298 Encounter for other specified prophylactic measures: Secondary | ICD-10-CM

## 2011-12-24 DIAGNOSIS — M542 Cervicalgia: Secondary | ICD-10-CM

## 2011-12-24 DIAGNOSIS — Z23 Encounter for immunization: Secondary | ICD-10-CM

## 2011-12-24 MED ORDER — HYDROCODONE-ACETAMINOPHEN 2.5-500 MG PO TABS: 1.0000 | ORAL_TABLET | Freq: Four times a day (QID) | ORAL | Status: DC | PRN

## 2011-12-24 MED ORDER — PREDNISONE 20 MG PO TABS: 40.0000 mg | ORAL_TABLET | Freq: Every day | ORAL | Status: AC

## 2011-12-24 MED ORDER — CYCLOBENZAPRINE HCL 10 MG PO TABS: 10.0000 mg | ORAL_TABLET | Freq: Every evening | ORAL | Status: AC | PRN
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
## 2011-12-24 NOTE — Patient Instructions (Signed)
Prednisone for 5 days.  Can use hydrocodone and tylenol with the prednisone. Ok to use the muscle relaxer at bedtime. When you finish the prednisone you start  4 tabs of Advil 3 times a day. We will call you with the xray report.

## 2012-01-06 ENCOUNTER — Encounter (HOSPITAL_COMMUNITY): Payer: Self-pay | Admitting: Psychology

## 2012-01-06 ENCOUNTER — Ambulatory Visit (INDEPENDENT_AMBULATORY_CARE_PROVIDER_SITE_OTHER): Payer: BC Managed Care – PPO | Admitting: Psychology

## 2012-01-06 DIAGNOSIS — F329 Major depressive disorder, single episode, unspecified: Secondary | ICD-10-CM

## 2012-01-06 DIAGNOSIS — F411 Generalized anxiety disorder: Secondary | ICD-10-CM
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
## 2012-01-19 ENCOUNTER — Ambulatory Visit (INDEPENDENT_AMBULATORY_CARE_PROVIDER_SITE_OTHER): Payer: BC Managed Care – PPO | Admitting: Psychology

## 2012-01-19 DIAGNOSIS — F411 Generalized anxiety disorder: Secondary | ICD-10-CM

## 2012-01-19 DIAGNOSIS — F329 Major depressive disorder, single episode, unspecified: Secondary | ICD-10-CM

## 2012-01-20 ENCOUNTER — Encounter (HOSPITAL_COMMUNITY): Payer: Self-pay | Admitting: Psychology

## 2012-01-20 NOTE — Progress Notes (Signed)
Patient ID: Anthony Morgan, male   DOB: [birth year], 41 y.o.   MRN: [id number]  Patient cancelled appointment due to illness.  No charge as this is patient's first failed appointment.

## 2012-01-24 ENCOUNTER — Ambulatory Visit (HOSPITAL_COMMUNITY): Payer: Self-pay | Admitting: Psychology

## 2012-11-20 ENCOUNTER — Other Ambulatory Visit: Payer: Self-pay | Admitting: *Deleted

## 2012-11-20 ENCOUNTER — Telehealth: Payer: Self-pay | Admitting: Family Medicine

## 2012-11-20 MED ORDER — IVERMECTIN 0.5 % EX LOTN: 1.0000 "application " | TOPICAL_LOTION | Freq: Once | CUTANEOUS | Status: DC

## 2012-11-20 NOTE — Telephone Encounter (Signed)
Daughter recently treated for lice. He now feels he has appeared like a prescription for sklice called to the pharmacy.

## 2013-06-28 ENCOUNTER — Encounter: Payer: Self-pay | Admitting: Family Medicine

## 2013-06-28 ENCOUNTER — Ambulatory Visit (INDEPENDENT_AMBULATORY_CARE_PROVIDER_SITE_OTHER): Payer: Self-pay | Admitting: Family Medicine

## 2013-06-28 VITALS — BP 143/79 | HR 79 | Ht 72.0 in | Wt 185.0 lb

## 2013-06-28 DIAGNOSIS — F17200 Nicotine dependence, unspecified, uncomplicated: Secondary | ICD-10-CM

## 2013-06-28 DIAGNOSIS — Z720 Tobacco use: Secondary | ICD-10-CM

## 2013-06-28 DIAGNOSIS — N529 Male erectile dysfunction, unspecified: Secondary | ICD-10-CM

## 2013-06-28 DIAGNOSIS — Z23 Encounter for immunization: Secondary | ICD-10-CM

## 2013-06-28 NOTE — Progress Notes (Signed)
   Subjective:    Patient ID: Anthony Morgan, male    DOB: 10/05/1970, 43 y.o.   MRN: [id number]  HPI ED x 1 month.  Now divorced and dating and hasn't been able to perform.  Difficulty maintaining erection . Not on any prescription medications.  No abnormal discharge.  No deformity or the penis.  No hx of undescended testicles.  Never had problems with intercourse during his marriage. No weakness or fatigue. He feels his desire and libido are normal.  Feels like unlikely to keep an erection.    Review of Systems     Objective:   Physical Exam  Constitutional: He appears well-developed and well-nourished.  HENT:  Head: Normocephalic and atraumatic.  Psychiatric: He has a normal mood and affect. His behavior is normal.   Did not perform a genital exam today.         Assessment & Plan:  ED - liklely psychological.  Though we'll check a CBC to evaluate for anemia. We'll also check a thyroid level. Also check prolactin. He did have a screen for diabetes at work which was normal. ED questionnaire score of 5. This is significant for erectile dysfunction. The testosterone quit his was positive for 2/10 questions which is essentially negative. Wil give sample of Viagra, Levitra, and Cialis.  Do not mix and don't take more than one is a 24 hour period.  Call and let me know which one he likes.  If pesistant then let me know for further work-up. Also encouraged smoking cessation.    H.O proved for each medication.

## 2013-06-28 NOTE — Patient Instructions (Addendum)
Sildenafil tablets (Viagra)  What is this medicine?  SILDENAFIL (sil DEN a fil) is used to treat erection problems in men.  This medicine may be used for other purposes; ask your health care provider or pharmacist if you have questions.  COMMON BRAND NAME(S): Viagra  What should I tell my health care provider before I take this medicine?  They need to know if you have any of these conditions:  -bleeding disorders  -eye or vision problems, including a rare inherited eye disease called retinitis pigmentosa  -anatomical deformation of the penis, Peyronie's disease, or history of priapism (painful and prolonged erection)  -heart disease, angina, a history of heart attack, irregular heart beats, or other heart problems  -high or low blood pressure  -history of blood diseases, like sickle cell anemia or leukemia  -history of stomach bleeding  -kidney disease  -liver disease  -stroke  -an unusual or allergic reaction to sildenafil, other medicines, foods, dyes, or preservatives  -pregnant or trying to get pregnant  -breast-feeding  How should I use this medicine?  Take this medicine by mouth with a glass of water. Follow the directions on the prescription label. The dose is usually taken 1 hour before sexual activity. You should not take the dose more than once per day. Do not take your medicine more often than directed.  Talk to your pediatrician regarding the use of this medicine in children. This medicine is not used in children for this condition.  Overdosage: If you think you have taken too much of this medicine contact a poison control center or emergency room at once.  NOTE: This medicine is only for you. Do not share this medicine with others.  What if I miss a dose?  This does not apply. Do not take double or extra doses.  What may interact with this medicine?  Do not take this medicine with any of the following medications:  -cisapride  -methscopolamine nitrate  -nitrates like amyl nitrite, isosorbide dinitrate,  isosorbide mononitrate, nitroglycerin  -nitroprusside  -other medicines for erectile dysfunction like avanafil, tadalafil, vardenafil  -other sildenafil products (Revatio)   This medicine may also interact with the following medications:  -certain drugs for high blood pressure  -certain drugs for the treatment of HIV infection or AIDS  -certain drugs used for fungal or yeast infections, like fluconazole, itraconazole, ketoconazole, and voriconazole  -cimetidine  -erythromycin  -rifampin  This list may not describe all possible interactions. Give your health care provider a list of all the medicines, herbs, non-prescription drugs, or dietary supplements you use. Also tell them if you smoke, drink alcohol, or use illegal drugs. Some items may interact with your medicine.  What should I watch for while using this medicine?  If you notice any changes in your vision while taking this drug, call your doctor or health care professional as soon as possible. Stop using this medicine and call your health care provider right away if you have a loss of sight in one or both eyes.  Contact your doctor or health care professional right away if you have an erection that lasts longer than 4 hours or if it becomes painful. This may be a sign of a serious problem and must be treated right away to prevent permanent damage.  If you experience symptoms of nausea, dizziness, chest pain or arm pain upon initiation of sexual activity after taking this medicine, you should refrain from further activity and call your doctor or health care professional as soon   pressure. Using this medicine does not protect you or your partner against HIV infection (the virus that causes AIDS)  or other sexually transmitted diseases. What side effects may I notice from receiving this medicine? Side effects that you should report to your doctor or health care professional as soon as possible: -allergic reactions like skin rash, itching or hives, swelling of the face, lips, or tongue -breathing problems -changes in hearing -changes in vision -chest pain -fast, irregular heartbeat -prolonged or painful erection -seizures  Side effects that usually do not require medical attention (report to your doctor or health care professional if they continue or are bothersome): -back pain -dizziness -flushing -headache -indigestion -muscle aches -nausea -stuffy or runny nose This list may not describe all possible side effects. Call your doctor for medical advice about side effects. You may report side effects to FDA at 1-800-FDA-1088. Where should I keep my medicine? Keep out of reach of children. Store at room temperature between 15 and 30 degrees C (59 and 86 degrees F). Throw away any unused medicine after the expiration date. NOTE: This sheet is a summary. It may not cover all possible information. If you have questions about this medicine, talk to your doctor, pharmacist, or health care provider.  2014, Elsevier/Gold Standard. (2012-03-29 12:43:54)   Tadalafil tablets (Cialis) What is this medicine? TADALAFIL (tah DA la fil) is used to treat erection problems in men. It is also used for enlargement of the prostate gland in men, a condition called benign prostatic hyperplasia or BPH. This medicine improves urine flow and reduces BPH symptoms. This medicine can also treat both erection problems and BPH when they occur together. This medicine may be used for other purposes; ask your health care provider or pharmacist if you have questions. COMMON BRAND NAME(S): Cialis What should I tell my health care provider before I take this medicine? They need to know if you have any of these  conditions: -bleeding disorders -eye or vision problems, including a rare inherited eye disease called retinitis pigmentosa -anatomical deformation of the penis, Peyronie's disease, or history of priapism (painful and prolonged erection) -heart disease, angina, a history of heart attack, irregular heart beats, or other heart problems -high or low blood pressure -history of blood diseases, like sickle cell anemia or leukemia -history of stomach bleeding -kidney disease -liver disease -stroke -an unusual or allergic reaction to tadalafil, other medicines, foods, dyes, or preservatives -pregnant or trying to get pregnant -breast-feeding How should I use this medicine? Take this medicine by mouth with a glass of water. Follow the directions on the prescription label. You may take this medicine with or without meals. When this medicine is used for erection problems, your doctor may prescribe it to be taken once daily or as needed. If you are taking the medicine as needed, you may be able to have sexual activity 30 minutes after taking it and for up to 36 hours after taking it. Whether you are taking the medicine as needed or once daily, you should not take more than one dose per day. If you are taking this medicine for symptoms of benign prostatic hyperplasia (BPH) or to treat both BPH and an erection problem, take the dose once daily at about the same time each day. Do not take your medicine more often than directed. Talk to your pediatrician regarding the use of this medicine in children. Special care may be needed. Overdosage: If you think you have taken too much of this medicine contact a  poison control center or emergency room at once. NOTE: This medicine is only for you. Do not share this medicine with others. What if I miss a dose? If you are taking this medicine as needed for erection problems, this does not apply. If you miss a dose while taking this medicine once daily for an erection  problem, benign prostatic hyperplasia, or both, take it as soon as you remember, but do not take more than one dose per day. What may interact with this medicine? Do not take this medicine with any of the following medications: -nitrates like amyl nitrite, isosorbide dinitrate, isosorbide mononitrate, nitroglycerin -other medicines for erectile dysfunction like avanafil, sildenafil, vardenafil -other tadalafil products (Adcirca)  This medicine may also interact with the following medications: -certain drugs for high blood pressure -certain drugs for the treatment of HIV infection or AIDS -certain drugs used for fungal or yeast infections, like fluconazole, itraconazole, ketoconazole, and voriconazole -certain drugs used for seizures like carbamazepine, phenytoin, and phenobarbital -grapefruit juice -macrolide antibiotics like clarithromycin, erythromycin, troleandomycin -medicines for prostate problems -rifabutin, rifampin or rifapentine This list may not describe all possible interactions. Give your health care provider a list of all the medicines, herbs, non-prescription drugs, or dietary supplements you use. Also tell them if you smoke, drink alcohol, or use illegal drugs. Some items may interact with your medicine. What should I watch for while using this medicine? If you notice any changes in your vision while taking this drug, call your doctor or health care professional as soon as possible. Stop using this medicine and call your health care provider right away if you have a loss of sight in one or both eyes. Contact your doctor or health care professional right away if the erection lasts longer than 4 hours or if it becomes painful. This may be a sign of serious problem and must be treated right away to prevent permanent damage. If you experience symptoms of nausea, dizziness, chest pain or arm pain upon initiation of sexual activity after taking this medicine, you should refrain from  further activity and call your doctor or health care professional as soon as possible. Do not drink alcohol to excess (examples, 5 glasses of wine or 5 shots of whiskey) when taking this medicine. When taken in excess, alcohol can increase your chances of getting a headache or getting dizzy, increasing your heart rate or lowering your blood pressure. Using this medicine does not protect you or your partner against HIV infection (the virus that causes AIDS) or other sexually transmitted diseases. What side effects may I notice from receiving this medicine? Side effects that you should report to your doctor or health care professional as soon as possible: -allergic reactions like skin rash, itching or hives, swelling of the face, lips, or tongue -breathing problems -changes in hearing -changes in vision -chest pain -fast, irregular heartbeat -prolonged or painful erection -seizures  Side effects that usually do not require medical attention (report to your doctor or health care professional if they continue or are bothersome): -back pain -dizziness -flushing -headache -indigestion -muscle aches -nausea -stuffy or runny nose This list may not describe all possible side effects. Call your doctor for medical advice about side effects. You may report side effects to FDA at 1-800-FDA-1088. Where should I keep my medicine? Keep out of the reach of children. Store at room temperature between 15 and 30 degrees C (59 and 86 degrees F). Throw away any unused medicine after the expiration date. NOTE: This sheet  is a summary. It may not cover all possible information. If you have questions about this medicine, talk to your doctor, pharmacist, or health care provider.  2014, Elsevier/Gold Standard. (2012-03-29 13:42:23)   Vardenafil tablets What is this medicine? VARDENAFIL (var den a fil) is used to treat erection problems in men. This medicine may be used for other purposes; ask your health care  provider or pharmacist if you have questions. COMMON BRAND NAME(S): Levitra What should I tell my health care provider before I take this medicine? They need to know if you have any of these conditions: -bleeding disorders -eye or vision problems, including a rare inherited eye disease called retinitis pigmentosa -anatomical deformation of the penis, Peyronie's disease, or history of priapism (painful and prolonged erection) -heart disease, angina, a history of heart attack, irregular heart beats, or other heart problems -high or low blood pressure -history of blood diseases, like sickle cell anemia or leukemia -history of stomach bleeding -kidney disease -liver disease -stroke -an unusual or allergic reaction to vardenafil, other medicines, foods, dyes, or preservatives -pregnant or trying to get pregnant -breast-feeding How should I use this medicine? Take this medicine by mouth with a glass of water. Follow the directions on the prescription label. You may take this medicine with or without meals. The dose is usually taken 1 hour before sexual activity. You should not take this dose more than once per day. Do not take your medicine more often than directed. Talk to your pediatrician regarding the use of this medicine in children. Special care may be needed. Overdosage: If you think you have taken too much of this medicine contact a poison control center or emergency room at once. NOTE: This medicine is only for you. Do not share this medicine with others. What if I miss a dose? This does not apply. What may interact with this medicine? Do not take this medicine with any of the following medications: -bepridil -certain medicines for fungal infections like fluconazole, itraconazole, ketoconazole, posaconazole, voriconazole -cisapride -droperidol -grepafloxacin -medicines for irregular heartbeat like dronedarone, dofetilide -methscopolamine nitrate -nitrates like amyl nitrite,  isosorbide dinitrate, isosorbide mononitrate, nitroglycerin -nitroprusside -other medicines for erectile dysfunction like avanafil, sildenafil, tadalafil -pimozide -thioridazine -ziprasidone  This medicine may also interact with the following medications: -antiviral medicines for HIV or AIDS -certain antibiotics like erythromycin and clarithromycin -certain drugs for high blood pressure -medicines for prostate problems -other medicines that prolong the QT interval (cause an abnormal heart rhythm) This list may not describe all possible interactions. Give your health care provider a list of all the medicines, herbs, non-prescription drugs, or dietary supplements you use. Also tell them if you smoke, drink alcohol, or use illegal drugs. Some items may interact with your medicine. What should I watch for while using this medicine? If you notice any changes in your vision while taking this drug, call your doctor or health care professional as soon as possible. Stop using this medicine and call your health care provider right away if you have a loss of sight in one or both eyes. Contact your doctor or health care professional right away if the erection lasts longer than 4 hours or if it becomes painful. This may be a sign of serious problem and must be treated right away to prevent permanent damage. If you experience symptoms of nausea, dizziness, chest pain or arm pain upon initiation of sexual activity after taking this medicine, you should refrain from further activity and call your doctor or health care professional  as soon as possible. Do not drink alcohol to excess (examples, 5 glasses of wine or 5 shots of whiskey) when taking this medicine. When taken in excess, alcohol can increase your chances of getting a headache or getting dizzy, increasing your heart rate or lowering your blood pressure. Using this medicine does not protect you or your partner against HIV infection (the virus that causes  AIDS) or other sexually transmitted diseases. What side effects may I notice from receiving this medicine? Side effects that you should report to your doctor or health care professional as soon as possible. -allergic reactions like skin rash, itching or hives, swelling of the face, lips, or tongue -breathing problems -changes in hearing -changes in vision -chest pain -fast, irregular heartbeat -prolonged or painful erection -seizures  Side effects that usually do not require medical attention (report to your doctor or health care professional if they continue or are bothersome): -back pain -dizziness -flushing -headache -indigestion -muscle aches -nausea -stuffy or runny nose This list may not describe all possible side effects. Call your doctor for medical advice about side effects. You may report side effects to FDA at 1-800-FDA-1088. Where should I keep my medicine? Keep out of the reach of children. Store at room temperature between 15 and 30 degrees C (59 and 86 degrees F). Throw away any unused medicine after the expiration date. NOTE: This sheet is a summary. It may not cover all possible information. If you have questions about this medicine, talk to your doctor, pharmacist, or health care provider.  2014, Elsevier/Gold Standard. (2012-12-18 15:23:24)

## 2013-06-29 LAB — CBC
HCT: 48.3 % (ref 39.0–52.0)
Hemoglobin: 16.9 g/dL (ref 13.0–17.0)
MCH: 31.4 pg (ref 26.0–34.0)
MCHC: 35 g/dL (ref 30.0–36.0)
MCV: 89.8 fL (ref 78.0–100.0)
PLATELETS: 241 10*3/uL (ref 150–400)
RBC: 5.38 MIL/uL (ref 4.22–5.81)
RDW: 13.3 % (ref 11.5–15.5)
WBC: 9.1 10*3/uL (ref 4.0–10.5)

## 2013-06-29 LAB — PROLACTIN: Prolactin: 5.4 ng/mL (ref 2.1–17.1)

## 2013-06-29 LAB — TSH: TSH: 1.094 u[IU]/mL (ref 0.350–4.500)

## 2013-06-29 NOTE — Progress Notes (Signed)
Quick Note:  All labs are normal. ______ 

## 2013-07-04 ENCOUNTER — Other Ambulatory Visit: Payer: Self-pay | Admitting: *Deleted

## 2013-07-04 MED ORDER — SILDENAFIL CITRATE 50 MG PO TABS: 50.0000 mg | ORAL_TABLET | Freq: Every day | ORAL | Status: DC | PRN

## 2013-07-23 ENCOUNTER — Telehealth: Payer: Self-pay | Admitting: *Deleted

## 2013-07-23 MED ORDER — SILDENAFIL CITRATE 20 MG PO TABS: 40.0000 mg | ORAL_TABLET | Freq: Every day | ORAL | Status: DC | PRN

## 2013-07-23 NOTE — Telephone Encounter (Signed)
Pt stated that he will need the generic for viagra and it only comes 20 mg

## 2013-12-05 ENCOUNTER — Other Ambulatory Visit: Payer: Self-pay | Admitting: Family Medicine

## 2013-12-29 ENCOUNTER — Encounter: Payer: Self-pay | Admitting: Emergency Medicine

## 2013-12-29 ENCOUNTER — Emergency Department (INDEPENDENT_AMBULATORY_CARE_PROVIDER_SITE_OTHER): Payer: BC Managed Care – PPO

## 2013-12-29 ENCOUNTER — Emergency Department (INDEPENDENT_AMBULATORY_CARE_PROVIDER_SITE_OTHER)
Admission: EM | Admit: 2013-12-29 | Discharge: 2013-12-29 | Disposition: A | Discharge status: Home / Self Care | Payer: BC Managed Care – PPO | Source: Home / Self Care | Attending: Emergency Medicine | Admitting: Emergency Medicine

## 2013-12-29 DIAGNOSIS — IMO0002 Reserved for concepts with insufficient information to code with codable children: Secondary | ICD-10-CM

## 2013-12-29 DIAGNOSIS — S62602A Fracture of unspecified phalanx of right middle finger, initial encounter for closed fracture: Secondary | ICD-10-CM

## 2013-12-29 DIAGNOSIS — X58XXXA Exposure to other specified factors, initial encounter: Secondary | ICD-10-CM

## 2013-12-29 DIAGNOSIS — S62609A Fracture of unspecified phalanx of unspecified finger, initial encounter for closed fracture: Secondary | ICD-10-CM

## 2013-12-29 IMAGING — CR DG FINGER MIDDLE 2+V*R*
2 series · 2 of 2 positions shown · non-contrast
Comparison: None.

CLINICAL DATA: Injury to the right long finger, jammed earlier
today. Initial encounter.

EXAM:
RIGHT MIDDLE FINGER 2+V

[view not recorded (1 of 2)]
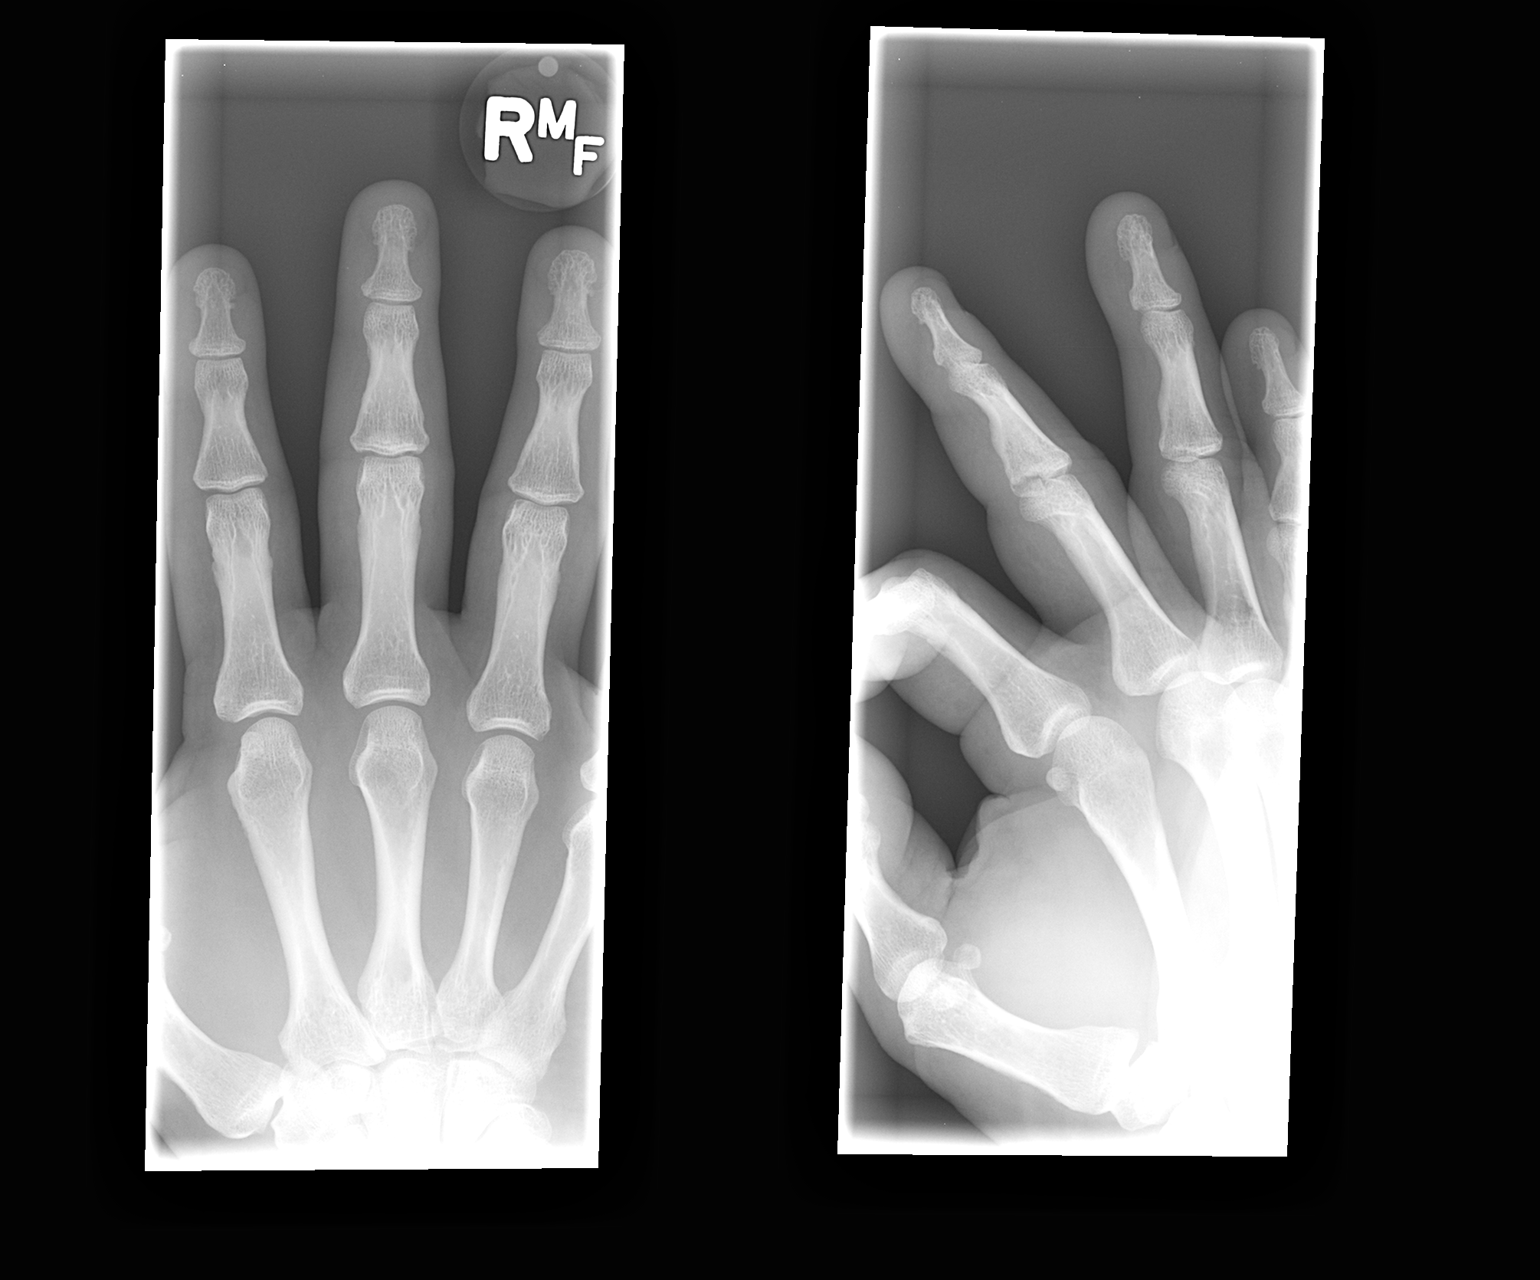

[view not recorded (2 of 2)]
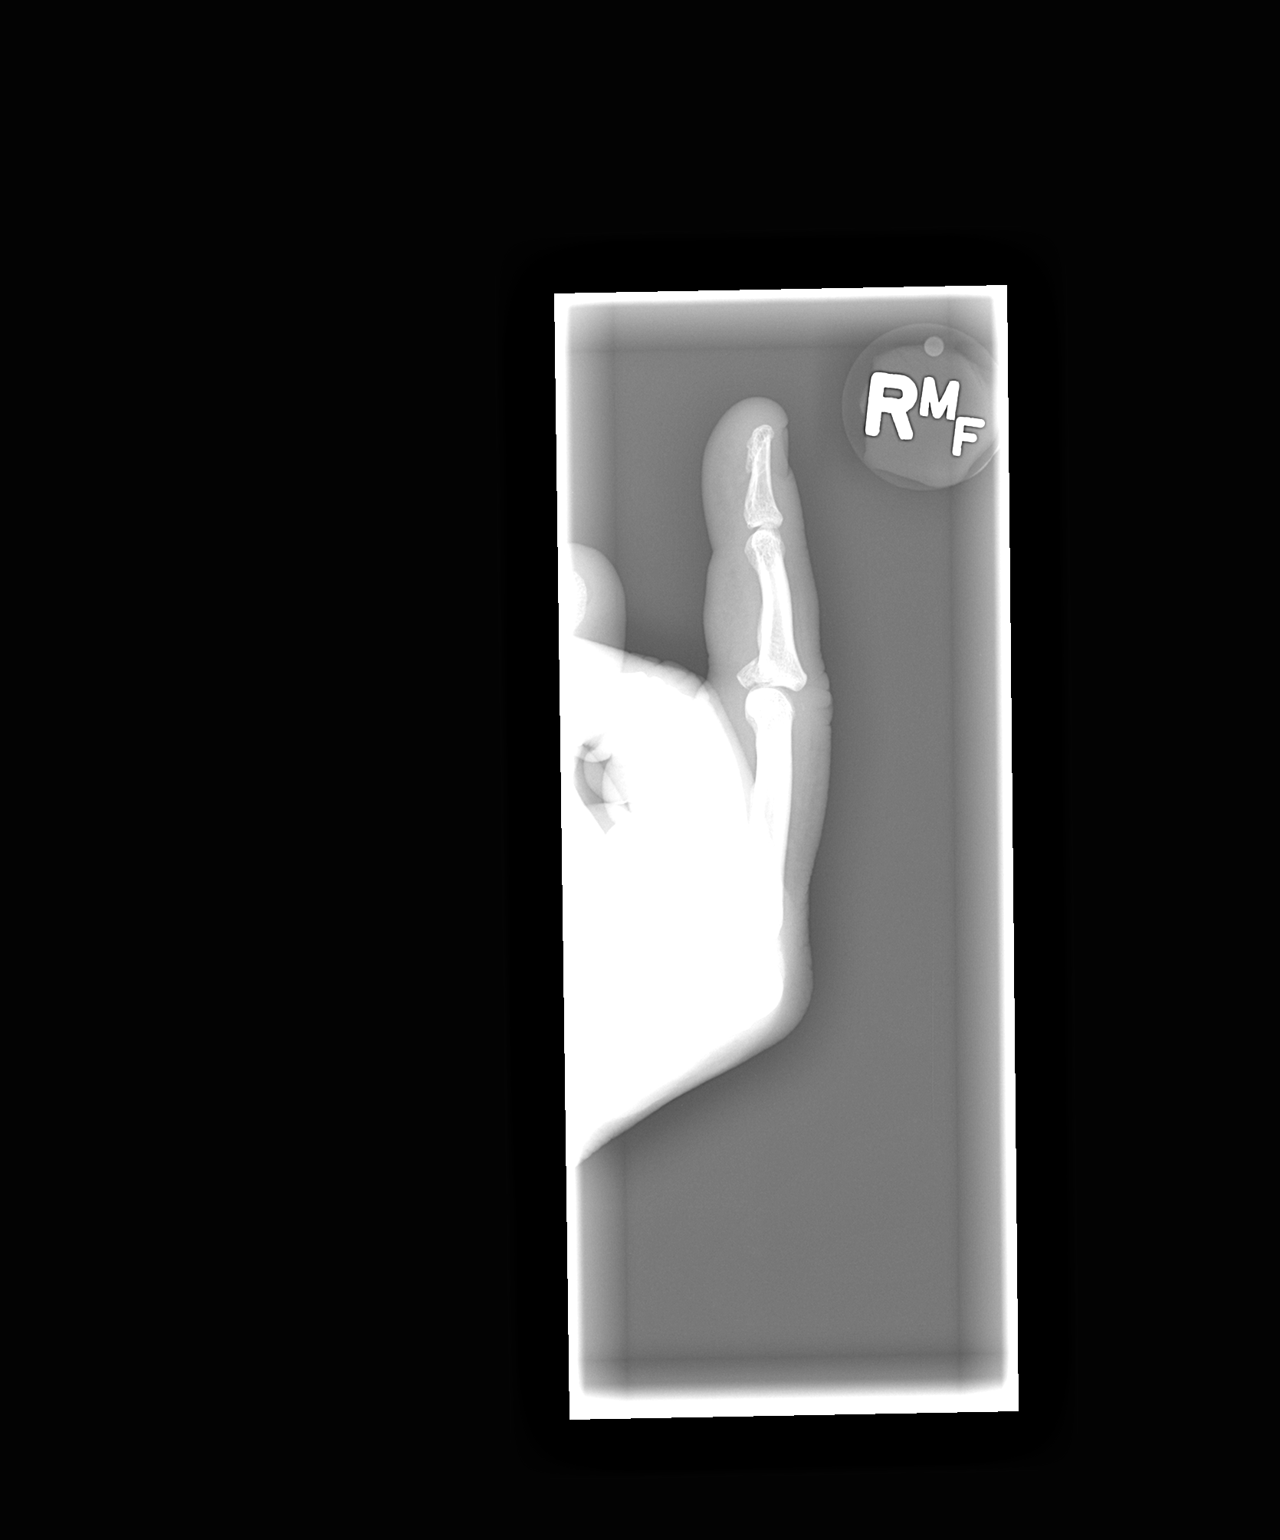

[2 of 2 positions shown; findings below may reference images not displayed]

FINDINGS: Mildly displaced comminuted fracture involving the volar base of the
proximal phalanx extending to the articular surface. No other
fractures. Slight dorsal subluxation of the PIP joint.
IMPRESSION: Mildly displaced comminuted intra-articular fracture involving the
volar base of the proximal phalanx with slight dorsal subluxation of
the PIP joint.

## 2013-12-29 MED ORDER — HYDROCODONE-ACETAMINOPHEN 5-325 MG PO TABS: 1.0000 | ORAL_TABLET | ORAL | Status: DC | PRN

## 2013-12-29 MED ORDER — IBUPROFEN 200 MG PO TABS: ORAL_TABLET | ORAL | Status: DC

## 2013-12-29 NOTE — ED Notes (Signed)
Reports baseball injury to right hand middle finger earlier today; cannot fully flex finger and has some bruising and edema.

## 2013-12-29 NOTE — ED Provider Notes (Signed)
CSN: [id number]     Arrival date & time 12/29/13  1643 History   First MD Initiated Contact with Patient 12/29/13 1702     Chief Complaint  Patient presents with  . Finger Injury    HPI Injured right third finger while playing baseball earlier today. He was the catcher, and a runner slid into his right hand. Complains of moderate to severe pain right third finger with decreased range of motion. No numbness or weakness. No open wound. Has not tried any medication for this Past Medical History  Diagnosis Date  . Anxiety    Past Surgical History  Procedure Laterality Date  . Tonsillectomy  1992  . Tonsilectomy, adenoidectomy, bilateral myringotomy and tubes  1992   Family History  Problem Relation Age of Onset  . Hypertension Father   . Hyperlipidemia Father   . Stroke      grandmother  . Cancer      grandfather/bladder ca  . Cancer Mother     breast  . Cancer Maternal Grandmother    History  Substance Use Topics  . Smoking status: Current Every Day Smoker -- 1.00 packs/day for 25 years    Types: Cigarettes  . Smokeless tobacco: Not on file  . Alcohol Use: No    Review of Systems  All other systems reviewed and are negative.   Allergies  Review of patient's allergies indicates no known allergies.  Home Medications   Prior to Admission medications   Medication Sig Start Date End Date Taking? Authorizing Provider  HYDROcodone-acetaminophen (NORCO/VICODIN) 5-325 MG per tablet Take 1-2 tablets by mouth every 4 (four) hours as needed for severe pain. Take with food. 12/29/13   Jacqulyn Cane, MD  ibuprofen (ADVIL,MOTRIN) 200 MG tablet Take three tablets ( 600 milligrams total) every 6 with food as needed for pain. 12/29/13   Jacqulyn Cane, MD  sildenafil (REVATIO) 20 MG tablet TAKE TWO TABLETS DAILY AS NEEDED. 12/05/13   [name], MD   BP 114/78  Pulse 113  Temp(Src) 98.3 F (36.8 C) (Oral)  Resp 16  Ht 6' (1.829 m)  Wt 185 lb (83.915 kg)  BMI 25.08 kg/m2   SpO2 99% Physical Exam  Nursing note and vitals reviewed. Constitutional: He is oriented to person, place, and time. He appears well-developed and well-nourished. No distress.  HENT:  Head: Normocephalic and atraumatic.  Eyes: Conjunctivae and EOM are normal. Pupils are equal, round, and reactive to light. No scleral icterus.  Neck: Normal range of motion.  Cardiovascular: Normal rate.   Pulmonary/Chest: Effort normal.  Abdominal: He exhibits no distension.  Musculoskeletal: Normal range of motion.  Neurological: He is alert and oriented to person, place, and time.  Skin: Skin is warm.  Psychiatric: He has a normal mood and affect.   Swollen, tender right third finger, especially PIP. Decreased range of motion. Pain upon flexion and palpation, and lateral and medial motion of the PIP.  No definite instability. No open wound. Neurovascular distally intact  ED Course  Procedures (including critical care time) Labs Review Labs Reviewed - No data to display  Imaging Review Dg Finger Middle Right  12/29/2013   CLINICAL DATA:  Injury to the right long finger, jammed earlier today. Initial encounter.  EXAM: RIGHT MIDDLE FINGER 2+V  COMPARISON:  None.  FINDINGS: Mildly displaced comminuted fracture involving the volar base of the proximal phalanx extending to the articular surface. No other fractures. Slight dorsal subluxation of the PIP joint.  IMPRESSION: Mildly displaced comminuted  intra-articular fracture involving the volar base of the proximal phalanx with slight dorsal subluxation of the PIP joint.   Electronically Signed   By: Evangeline Dakin M.D.   On: 12/29/2013 17:21     MDM   1. Fracture of third finger, right, closed, initial encounter   Mildly displaced comminuted intra-articular fracture involving the volar base of the proximal phalanx with slight dorsal subluxation of the PIP joint.   Treatment options discussed, as well as risks, benefits, alternatives. Given that  today is Saturday, I I advised that I call now and help arrange for patient to see an orthopedist in a local emergency room now--Patient declined this option. Patient voiced understanding and agreement with the following plans: Using a long finger splint (from r 3rd fingertip to r wrist), I fashioned a splint to immobilize right third finger in neutral, slightly flexed position. I then wrapped the entire right hand with Ace bandage. Apply ice tonight and keep elevated.Encouraged rest, ice, compression with ACE bandage, and elevation. OTC ibuprofen when necessary moderate pain. Vicodin prescription. #12. No refills. When necessary severe pain. Follow-up with your ortho or sports medicne in 2 days-Monday. Patient states he prefers appointment with Dr. Darene Lamer. Monday 9/21, and we'll assist with that referral when Dr. T.'s office opens on Monday. Precautions discussed. Red flags discussed.--Go to emergency room if any red flags  Questions invited and answered. Patient and wife voiced understanding and agreement.      Jacqulyn Cane, MD 12/29/13 [phone]

## 2013-12-31 ENCOUNTER — Ambulatory Visit (INDEPENDENT_AMBULATORY_CARE_PROVIDER_SITE_OTHER): Payer: BC Managed Care – PPO | Admitting: Sports Medicine

## 2013-12-31 ENCOUNTER — Encounter: Payer: Self-pay | Admitting: Sports Medicine

## 2013-12-31 VITALS — BP 139/83 | HR 110 | Ht 71.5 in | Wt 189.0 lb

## 2013-12-31 DIAGNOSIS — IMO0002 Reserved for concepts with insufficient information to code with codable children: Secondary | ICD-10-CM | POA: Diagnosis not present

## 2013-12-31 DIAGNOSIS — IMO0001 Reserved for inherently not codable concepts without codable children: Secondary | ICD-10-CM | POA: Insufficient documentation

## 2013-12-31 DIAGNOSIS — S62622A Displaced fracture of medial phalanx of right middle finger, initial encounter for closed fracture: Secondary | ICD-10-CM

## 2013-12-31 MED ORDER — OXYCODONE-ACETAMINOPHEN 5-325 MG PO TABS: 1.0000 | ORAL_TABLET | Freq: Three times a day (TID) | ORAL | Status: DC | PRN

## 2013-12-31 NOTE — Progress Notes (Signed)
Patient ID: Anthony Morgan, male   DOB: 03/10/1971, 43 y.o.   MRN: [id number]   Subjective:    I'm seeing this patient as a consultation for:  Dr. Jacqulyn Cane  CC: Right middle finger injury  HPI: While playing softball this pleasant 43 year old male was tackled by another participant, he had immediate pain, swelling, loss of function. He was seen in urgent care where x-rays showed a fracture through the volar base of the middle phalanx, third finger, right hand. He was referred to me for further evaluation and definitive treatment.  Past medical history, Surgical history, Family history not pertinant except as noted below, Social history, Allergies, and medications have been entered into the medical record, reviewed, and no changes needed.   Review of Systems: No headache, visual changes, nausea, vomiting, diarrhea, constipation, dizziness, abdominal pain, skin rash, fevers, chills, night sweats, weight loss, swollen lymph nodes, body aches, joint swelling, muscle aches, chest pain, shortness of breath, mood changes, visual or auditory hallucinations.   Objective:   General: Well Developed, well nourished, and in no acute distress.  Neuro/Psych: Alert and oriented x3, extra-ocular muscles intact, able to move all 4 extremities, sensation grossly intact. Skin: Warm and dry, no rashes noted.  Respiratory: Not using accessory muscles, speaking in full sentences, trachea midline.  Cardiovascular: Pulses palpable, no extremity edema. Abdomen: Does not appear distended. Right hand: Swollen, tender to palpation at the volar PIP joint, good strength to extension at the PIP and DIP joints, good strength flexion at the DIP joint very weak at the PIP. Collaterals were stable. Neurovascularly intact distally.  X-rays reviewed and did show a volar fracture at the base of the middle phalanx of the third finger of the right hand, this includes approximately 20-25% of the articular surface and it does show  some dorsal subluxation of the base of the middle phalanx.  Impression and Recommendations:   This case required medical decision making of moderate complexity.

## 2013-12-31 NOTE — Assessment & Plan Note (Addendum)
Fractures 35 days old. Unfortunately there is a good 20-25% of the articular surface involved, with dorsal subluxation of the base of the middle phalanx. For this reason I am awaiting call back from hand surgery. Continue static splint, the PIP has been placed approximately 50 of flexion. Referral to hand surgery.

## 2014-01-02 ENCOUNTER — Ambulatory Visit: Payer: Self-pay | Admitting: Family Medicine

## 2014-01-02 DIAGNOSIS — Z0289 Encounter for other administrative examinations: Secondary | ICD-10-CM

## 2014-01-11 ENCOUNTER — Other Ambulatory Visit: Payer: Self-pay | Admitting: *Deleted

## 2014-01-11 MED ORDER — SILDENAFIL CITRATE 20 MG PO TABS: ORAL_TABLET | ORAL | Status: DC

## 2014-06-25 ENCOUNTER — Ambulatory Visit (INDEPENDENT_AMBULATORY_CARE_PROVIDER_SITE_OTHER): Payer: BLUE CROSS/BLUE SHIELD | Admitting: Family Medicine

## 2014-06-25 ENCOUNTER — Encounter: Payer: Self-pay | Admitting: Family Medicine

## 2014-06-25 VITALS — BP 150/88 | HR 115 | Wt 203.0 lb

## 2014-06-25 DIAGNOSIS — D239 Other benign neoplasm of skin, unspecified: Secondary | ICD-10-CM

## 2014-06-25 DIAGNOSIS — H1132 Conjunctival hemorrhage, left eye: Secondary | ICD-10-CM | POA: Diagnosis not present

## 2014-06-25 DIAGNOSIS — L989 Disorder of the skin and subcutaneous tissue, unspecified: Secondary | ICD-10-CM

## 2014-06-25 DIAGNOSIS — D229 Melanocytic nevi, unspecified: Secondary | ICD-10-CM

## 2014-06-25 NOTE — Patient Instructions (Signed)
Pathology result should be back in and about a week and we will give you a call at that time. Okay to remove the bandage tomorrow and take a shower. She sees fingertips to clean the area with soap and water. Do not need to scrub at it. Apply small amount of Vaseline twice a day to the area until completely healed.

## 2014-06-25 NOTE — Progress Notes (Signed)
   Subjective:    Patient ID: Anthony Morgan, male    DOB: 10/19/1970, 44 y.o.   MRN: [id number]  HPI Mole on the right side unde the axilla tha he would like to removed.  He feels like it's gotten darker in color and little bit more raised than it used to be.  Has a lesion on his right upper lid that has been there for several years. He says it recently and was formed like a whitehead and he picked it off and has been irritated and red since then. He would like to have the lesion removed completely.  He also had gastroenteritis over the weekend and popped a blood vessel in his left eye. He wants and if there's any special treatment that he should be doing to get it to go away. He's not having any pain in the eye or vision changes.  Review of Systems     Objective:   Physical Exam  Constitutional: He is oriented to person, place, and time. He appears well-developed and well-nourished.  HENT:  Head: Normocephalic and atraumatic.  Neurological: He is alert and oriented to person, place, and time.  Skin: Skin is warm and dry.  On the right side of the torso near the axilla he has an approximately 6 mm round raised rough brown lesion. On the right upper eyelid he has a pink papular lesion with a little bit of fine scale and a scab in the center. No active drainage.  Psychiatric: He has a normal mood and affect. His behavior is normal.          Assessment & Plan:  The mole on his right side looks most consistent with a seborrheic keratosis but since he has noticed a recent color change and he is very concerned, so recommended a shave biopsy just to be sure. He does have very fair skin.  Conjunctival hemorrhage-gave reassurance. No additional treatment needed. The body should resorb the blood on its own. Certainly if he gets worse, he notices any pain in the eye or Excedrin to his vision changes and he should let us know immediately.  For the lesion on his right upper eyelid it's difficult  to tell exactly what it is. Suspect either a milia or possible sebaceous cyst. Right now has a scab in the center says a little difficult to tell. I did recommend referral to dermatology for further treatment  Shave Biopsy Procedure Note  Pre-operative Diagnosis: Suspicious lesion  Post-operative Diagnosis: same  Locations:right trunk  Indications: change in color and texture  Anesthesia: Lidocaine 1% with epinephrine without added sodium bicarbonate  Procedure Details   Patient informed of the risks (including bleeding and infection) and benefits of the  procedure and Verbal informed consent obtained.  The lesion and surrounding area were given a sterile prep using chlorhexidine and draped in the usual sterile fashion. A scalpel was used to shave an area of skin approximately 56mm by 55mm.  Hemostasis achieved with alumuninum chloride. Antibiotic ointment and a sterile dressing applied.  The specimen was sent for pathologic examination. The patient tolerated the procedure well.  EBL: 0 ml  Findings: Await pathology   Condition: Stable  Complications: none.  Plan: 1. Instructed to keep the wound dry and covered for 24-48h and clean thereafter. 2. Warning signs of infection were reviewed.   3. Recommended that the patient use OTC acetaminophen as needed for pain.  4. Return in PRN.

## 2015-03-25 ENCOUNTER — Encounter: Payer: Self-pay | Admitting: Family Medicine

## 2015-03-25 ENCOUNTER — Ambulatory Visit (INDEPENDENT_AMBULATORY_CARE_PROVIDER_SITE_OTHER): Payer: BLUE CROSS/BLUE SHIELD | Admitting: Family Medicine

## 2015-03-25 VITALS — BP 129/91 | HR 88 | Wt 198.0 lb

## 2015-03-25 DIAGNOSIS — F411 Generalized anxiety disorder: Secondary | ICD-10-CM | POA: Diagnosis not present

## 2015-03-25 DIAGNOSIS — Z0000 Encounter for general adult medical examination without abnormal findings: Secondary | ICD-10-CM

## 2015-03-25 DIAGNOSIS — Z0001 Encounter for general adult medical examination with abnormal findings: Secondary | ICD-10-CM | POA: Diagnosis not present

## 2015-03-25 DIAGNOSIS — K219 Gastro-esophageal reflux disease without esophagitis: Secondary | ICD-10-CM | POA: Diagnosis not present

## 2015-03-25 MED ORDER — SERTRALINE HCL 50 MG PO TABS: ORAL_TABLET | ORAL | Status: DC

## 2015-03-25 MED ORDER — PANTOPRAZOLE SODIUM 40 MG PO TBEC: 40.0000 mg | DELAYED_RELEASE_TABLET | Freq: Every day | ORAL | Status: DC

## 2015-03-25 NOTE — Patient Instructions (Addendum)
Food Choices for Gastroesophageal Reflux Disease, Adult When you have gastroesophageal reflux disease (GERD), the foods you eat and your eating habits are very important. Choosing the right foods can help ease the discomfort of GERD. WHAT GENERAL GUIDELINES DO I NEED TO FOLLOW?  Choose fruits, vegetables, whole grains, low-fat dairy products, and low-fat meat, fish, and poultry.  Limit fats such as oils, salad dressings, butter, nuts, and avocado.  Keep a food diary to identify foods that cause symptoms.  Avoid foods that cause reflux. These may be different for different people.  Eat frequent small meals instead of three large meals each day.  Eat your meals slowly, in a relaxed setting.  Limit fried foods.  Cook foods using methods other than frying.  Avoid drinking alcohol.  Avoid drinking large amounts of liquids with your meals.  Avoid bending over or lying down until 2-3 hours after eating. WHAT FOODS ARE NOT RECOMMENDED? The following are some foods and drinks that may worsen your symptoms: Vegetables Tomatoes. Tomato juice. Tomato and spaghetti sauce. Chili peppers. Onion and garlic. Horseradish. Fruits Oranges, grapefruit, and lemon (fruit and juice). Meats High-fat meats, fish, and poultry. This includes hot dogs, ribs, ham, sausage, salami, and bacon. Dairy Whole milk and chocolate milk. Sour cream. Cream. Butter. Ice cream. Cream cheese.  Beverages Coffee and tea, with or without caffeine. Carbonated beverages or energy drinks. Condiments Hot sauce. Barbecue sauce.  Sweets/Desserts Chocolate and cocoa. Donuts. Peppermint and spearmint. Fats and Oils High-fat foods, including [place] fries and potato chips. Other Vinegar. Strong spices, such as black pepper, white pepper, red pepper, cayenne, curry powder, cloves, ginger, and chili powder. The items listed above may not be a complete list of foods and beverages to avoid. Contact your dietitian for more  information.   This information is not intended to replace advice given to you by your health care provider. Make sure you discuss any questions you have with your health care provider.   Document Released: 03/29/2005 Document Revised: 04/19/2014 Document Reviewed: 01/31/2013 Elsevier Interactive Patient Education 2016 Elsevier Inc[employer]    Insomnia Insomnia is a sleep disorder that makes it difficult to fall asleep or to stay asleep. Insomnia can cause tiredness (fatigue), low energy, difficulty concentrating, mood swings, and poor performance at work or school.  There are three different ways to classify insomnia:  Difficulty falling asleep.  Difficulty staying asleep.  Waking up too early in the morning. Any type of insomnia can be long-term (chronic) or short-term (acute). Both are common. Short-term insomnia usually lasts for three months or less. Chronic insomnia occurs at least three times a week for longer than three months. CAUSES  Insomnia may be caused by another condition, situation, or substance, such as:  Anxiety.  Certain medicines.  Gastroesophageal reflux disease (GERD) or other gastrointestinal conditions.  Asthma or other breathing conditions.  Restless legs syndrome, sleep apnea, or other sleep disorders.  Chronic pain.  Menopause. This may include hot flashes.  Stroke.  Abuse of alcohol, tobacco, or illegal drugs.  Depression.  Caffeine.   Neurological disorders, such as Alzheimer disease.  An overactive thyroid (hyperthyroidism). The cause of insomnia may not be known. RISK FACTORS Risk factors for insomnia include:  Gender. Women are more commonly affected than men.  Age. Insomnia is more common as you get older.  Stress. This may involve your professional or personal life.  Income. Insomnia is more common in people with lower income.  Lack of exercise.   Irregular work schedule or  night shifts.  Traveling between different time  zones. SIGNS AND SYMPTOMS If you have insomnia, trouble falling asleep or trouble staying asleep is the main symptom. This may lead to other symptoms, such as:  Feeling fatigued.  Feeling nervous about going to sleep.  Not feeling rested in the morning.  Having trouble concentrating.  Feeling irritable, anxious, or depressed. TREATMENT  Treatment for insomnia depends on the cause. If your insomnia is caused by an underlying condition, treatment will focus on addressing the condition. Treatment may also include:   Medicines to help you sleep.  Counseling or therapy.  Lifestyle adjustments. HOME CARE INSTRUCTIONS   Take medicines only as directed by your health care provider.  Keep regular sleeping and waking hours. Avoid naps.  Keep a sleep diary to help you and your health care provider figure out what could be causing your insomnia. Include:   When you sleep.  When you wake up during the night.  How well you sleep.   How rested you feel the next day.  Any side effects of medicines you are taking.  What you eat and drink.   Make your bedroom a comfortable place where it is easy to fall asleep:  Put up shades or special blackout curtains to block light from outside.  Use a white noise machine to block noise.  Keep the temperature cool.   Exercise regularly as directed by your health care provider. Avoid exercising right before bedtime.  Use relaxation techniques to manage stress. Ask your health care provider to suggest some techniques that may work well for you. These may include:  Breathing exercises.  Routines to release muscle tension.  Visualizing peaceful scenes.  Cut back on alcohol, caffeinated beverages, and cigarettes, especially close to bedtime. These can disrupt your sleep.  Do not overeat or eat spicy foods right before bedtime. This can lead to digestive discomfort that can make it hard for you to sleep.  Limit screen use before  bedtime. This includes:  Watching TV.  Using your smartphone, tablet, and computer.  Stick to a routine. This can help you fall asleep faster. Try to do a quiet activity, brush your teeth, and go to bed at the same time each night.  Get out of bed if you are still awake after 15 minutes of trying to sleep. Keep the lights down, but try reading or doing a quiet activity. When you feel sleepy, go back to bed.  Make sure that you drive carefully. Avoid driving if you feel very sleepy.  Keep all follow-up appointments as directed by your health care provider. This is important. SEEK MEDICAL CARE IF:   You are tired throughout the day or have trouble in your daily routine due to sleepiness.  You continue to have sleep problems or your sleep problems get worse. SEEK IMMEDIATE MEDICAL CARE IF:   You have serious thoughts about hurting yourself or someone else.   This information is not intended to replace advice given to you by your health care provider. Make sure you discuss any questions you have with your health care provider.   Document Released: 03/26/2000 Document Revised: 12/18/2014 Document Reviewed: 12/28/2013 Elsevier Interactive Patient Education [employer].

## 2015-03-25 NOTE — Progress Notes (Signed)
Subjective:    Patient ID: Anthony Morgan, male    DOB: [birth year], 44 y.o.   MRN: [id number]  HPI  Here for CPE.    He has been having problems sleeping.  Says her 48 month old grandson passed away.  This has been very stressful for him. He has a underlying diagnosis of generalized anxiety and has not been on medication for the past couple years. He was on sertraline for about a year and half with Dr. Loni Morgan. He's concerned it is getting more fatigued because she's not sleeping well.  Has been having more indigestion. Sxs almost every day for several weeks.  No dietary changes.  Maybe takes an NSAID about once a week. Says baking soda and mixed with water seems to relieve his sxs.    Review of Systems Comprehensive ROS is neg   BP 160/111 mmHg  Pulse 75  Wt 198 lb (89.812 kg)  SpO2 99%    No Known Allergies  Past Medical History  Diagnosis Date  . Anxiety     Past Surgical History  Procedure Laterality Date  . Tonsillectomy  1992  . Tonsilectomy, adenoidectomy, bilateral myringotomy and tubes  1992    Social History   Social History  . Marital Status: Married    Spouse Name: N/A  . Number of Children: 1  . Years of Education: N/A   Occupational History  . TECH/MACH OPERATOR    Social History Main Topics  . Smoking status: Current Every Day Smoker -- 1.00 packs/day for 25 years    Types: Cigarettes  . Smokeless tobacco: Not on file  . Alcohol Use: No  . Drug Use: Yes     Comment: Patient reports that he stopped 3 months.  . Sexual Activity: No     Comment: .   Other Topics Concern  . Not on file   Social History Narrative   Daughter is Anthony Morgan and she is also a patient here. Separated from his wife.     Family History  Problem Relation Age of Onset  . Hypertension Father   . Hyperlipidemia Father   . Stroke      grandmother  . Cancer      grandfather/bladder ca  . Cancer Mother     breast  . Cancer Maternal Grandmother     Outpatient  Encounter Prescriptions as of 03/25/2015  Medication Sig  . [DISCONTINUED] HYDROcodone-acetaminophen (NORCO/VICODIN) 5-325 MG per tablet Take 1-2 tablets by mouth every 4 (four) hours as needed for severe pain. Take with food.  . [DISCONTINUED] ibuprofen (ADVIL,MOTRIN) 200 MG tablet Take three tablets ( 600 milligrams total) every 6 with food as needed for pain.  . [DISCONTINUED] oxyCODONE-acetaminophen (PERCOCET/ROXICET) 5-325 MG per tablet Take 1 tablet by mouth every 8 (eight) hours as needed.  . [DISCONTINUED] sildenafil (REVATIO) 20 MG tablet TAKE TWO TABLETS DAILY AS NEEDED.   No facility-administered encounter medications on file as of 03/25/2015.          Objective:   Physical Exam  Constitutional: He is oriented to person, place, and time. He appears well-developed and well-nourished.  HENT:  Head: Normocephalic and atraumatic.  Right Ear: External ear normal.  Left Ear: External ear normal.  Nose: Nose normal.  Mouth/Throat: Oropharynx is clear and moist.  Eyes: Conjunctivae and EOM are normal. Pupils are equal, round, and reactive to light.  Neck: Normal range of motion. Neck supple. No thyromegaly present.  Cardiovascular: Normal rate, regular rhythm, normal heart sounds  and intact distal pulses.   Pulmonary/Chest: Effort normal and breath sounds normal.  Abdominal: Soft. Bowel sounds are normal. He exhibits no distension and no mass. There is no tenderness. There is no rebound and no guarding.  Musculoskeletal: Normal range of motion.  Lymphadenopathy:    He has no cervical adenopathy.  Neurological: He is alert and oriented to person, place, and time. He has normal reflexes.  Skin: Skin is warm and dry.  Psychiatric: He has a normal mood and affect. His behavior is normal. Judgment and thought content normal.          Assessment & Plan:  CPE - K eep up a regular exercise program and make sure you are eating a healthy diet Try to eat 4 servings of dairy a day,  or if you are lactose intolerant take a calcium with vitamin D daily.  Your vaccines are up to date.   GERD - Recommend trial of PPI. Call if not better in 2 weeks.  Given handout on GERD diet. Follow-up in one month. If he is doing well at that point in time then we can try weaning the medication. I explained that we use this medication  for short period time.   Generalized anxiety disorder-gad 7 score of 10 today and PHQ 9 score of 5. We discussed several options. He was on sertraline for about a year and a half with Dr. Loni Morgan her psychiatrist downstairs. We discussed maybe going back on that medication chart with the 50 mg dose. He has tried some sleep medicines in the past but said it made him too groggy. I think this is in particular trazodone. I like to see back in a month to see how is doing and adjust the medication if needed.

## 2015-04-04 ENCOUNTER — Other Ambulatory Visit: Payer: Self-pay | Admitting: Family Medicine

## 2015-04-04 LAB — CBC WITH DIFFERENTIAL/PLATELET
BASOS ABS: 0.1 10*3/uL (ref 0.0–0.1)
Basophils Relative: 1 % (ref 0–1)
EOS PCT: 6 % — AB (ref 0–5)
Eosinophils Absolute: 0.5 10*3/uL (ref 0.0–0.7)
HCT: 46.7 % (ref 39.0–52.0)
Hemoglobin: 16.2 g/dL (ref 13.0–17.0)
LYMPHS PCT: 29 % (ref 12–46)
Lymphs Abs: 2.3 10*3/uL (ref 0.7–4.0)
MCH: 30.7 pg (ref 26.0–34.0)
MCHC: 34.7 g/dL (ref 30.0–36.0)
MCV: 88.4 fL (ref 78.0–100.0)
MPV: 9.6 fL (ref 8.6–12.4)
Monocytes Absolute: 0.6 10*3/uL (ref 0.1–1.0)
Monocytes Relative: 7 % (ref 3–12)
Neutro Abs: 4.5 10*3/uL (ref 1.7–7.7)
Neutrophils Relative %: 57 % (ref 43–77)
PLATELETS: 222 10*3/uL (ref 150–400)
RBC: 5.28 MIL/uL (ref 4.22–5.81)
RDW: 12.9 % (ref 11.5–15.5)
WBC: 7.9 10*3/uL (ref 4.0–10.5)

## 2015-04-04 LAB — LIPID PANEL
Cholesterol: 268 mg/dL — ABNORMAL HIGH (ref 125–200)
HDL: 41 mg/dL (ref >=40)
Total CHOL/HDL Ratio: 6.5 Ratio — ABNORMAL HIGH (ref <=5.0)
Triglycerides: 423 mg/dL — ABNORMAL HIGH (ref <150)

## 2015-04-04 LAB — TSH: TSH: 1.231 u[IU]/mL (ref 0.350–4.500)

## 2015-04-08 ENCOUNTER — Telehealth: Payer: Self-pay

## 2015-04-08 LAB — LDL CHOLESTEROL, DIRECT: LDL DIRECT: 146 mg/dL — AB (ref <130)

## 2015-04-08 NOTE — Telephone Encounter (Signed)
Called [facility], direct ldl added to patient lab work.  Unable to contact patient regarding results.

## 2015-04-08 NOTE — Telephone Encounter (Signed)
-----   Message from [name], MD sent at 04/07/2015  7:29 PM EST ----- Call pt: thyroid is normal. Cholesterol is high.  Call labs and see if can add a direct LDL.I would recommend a choleserol lowering medication.  Blood count is OK.  His eosinophils are up  A littl whch can be seen in those with allergies.

## 2015-04-09 ENCOUNTER — Telehealth: Payer: Self-pay

## 2015-04-09 NOTE — Telephone Encounter (Signed)
-----   Message from [name], MD sent at 04/08/2015  5:10 PM EST ----- Call pt: LDL is better than the last time we checked it.Anthony Morgan work.

## 2015-04-09 NOTE — Telephone Encounter (Signed)
Patient aware of lab results and recommendations. 

## 2015-05-19 ENCOUNTER — Encounter: Payer: Self-pay | Admitting: Family Medicine

## 2015-05-19 ENCOUNTER — Ambulatory Visit (INDEPENDENT_AMBULATORY_CARE_PROVIDER_SITE_OTHER): Payer: BLUE CROSS/BLUE SHIELD | Admitting: Family Medicine

## 2015-05-19 VITALS — BP 141/96 | HR 117 | Wt 197.0 lb

## 2015-05-19 DIAGNOSIS — S39012A Strain of muscle, fascia and tendon of lower back, initial encounter: Secondary | ICD-10-CM | POA: Insufficient documentation

## 2015-05-19 DIAGNOSIS — R030 Elevated blood-pressure reading, without diagnosis of hypertension: Secondary | ICD-10-CM

## 2015-05-19 DIAGNOSIS — S338XXA Sprain of other parts of lumbar spine and pelvis, initial encounter: Secondary | ICD-10-CM

## 2015-05-19 MED ORDER — CYCLOBENZAPRINE HCL 10 MG PO TABS: 10.0000 mg | ORAL_TABLET | Freq: Three times a day (TID) | ORAL | Status: DC | PRN

## 2015-05-19 MED ORDER — NAPROXEN 500 MG PO TABS: 500.0000 mg | ORAL_TABLET | Freq: Two times a day (BID) | ORAL | Status: DC

## 2015-05-19 NOTE — Assessment & Plan Note (Signed)
Blood pressure elevated likely due to pain. Follow-up with PCP.

## 2015-05-19 NOTE — Patient Instructions (Signed)
Thank you for coming in today. Come back or go to the emergency room if you notice new weakness new numbness problems walking or bowel or bladder problems.  TENS UNIT: This is helpful for muscle pain and spasm.   Search and Purchase a TENS 7000 2nd edition at www.tenspros.com. It should be less than $30.     TENS unit instructions: Do not shower or bathe with the unit on Turn the unit off before removing electrodes or batteries If the electrodes lose stickiness add a drop of water to the electrodes after they are disconnected from the unit and place on plastic sheet. If you continued to have difficulty, call the TENS unit company to purchase more electrodes. Do not apply lotion on the skin area prior to use. Make sure the skin is clean and dry as this will help prolong the life of the electrodes. After use, always check skin for unusual red areas, rash or other skin difficulties. If there are any skin problems, does not apply electrodes to the same area. Never remove the electrodes from the unit by pulling the wires. Do not use the TENS unit or electrodes other than as directed. Do not change electrode placement without consultating your therapist or physician. Keep 2 fingers with between each electrode. Wear time ratio is 2:1, on to off times.    For example on for 30 minutes off for 15 minutes and then on for 30 minutes off for 15 minutes   Lumbosacral Strain Lumbosacral strain is a strain of any of the parts that make up your lumbosacral vertebrae. Your lumbosacral vertebrae are the bones that make up the lower third of your backbone. Your lumbosacral vertebrae are held together by muscles and tough, fibrous tissue (ligaments).  CAUSES  A sudden blow to your back can cause lumbosacral strain. Also, anything that causes an excessive stretch of the muscles in the low back can cause this strain. This is typically seen when people exert themselves strenuously, fall, lift heavy objects,  bend, or crouch repeatedly. RISK FACTORS  Physically demanding work.  Participation in pushing or pulling sports or sports that require a sudden twist of the back (tennis, golf, baseball).  Weight lifting.  Excessive lower back curvature.  Forward-tilted pelvis.  Weak back or abdominal muscles or both.  Tight hamstrings. SIGNS AND SYMPTOMS  Lumbosacral strain may cause pain in the area of your injury or pain that moves (radiates) down your leg.  DIAGNOSIS Your health care provider can often diagnose lumbosacral strain through a physical exam. In some cases, you may need tests such as X-ray exams.  TREATMENT  Treatment for your lower back injury depends on many factors that your clinician will have to evaluate. However, most treatment will include the use of anti-inflammatory medicines. HOME CARE INSTRUCTIONS   Avoid hard physical activities (tennis, racquetball, waterskiing) if you are not in proper physical condition for it. This may aggravate or create problems.  If you have a back problem, avoid sports requiring sudden body movements. Swimming and walking are generally safer activities.  Maintain good posture.  Maintain a healthy weight.  For acute conditions, you may put ice on the injured area.  Put ice in a plastic bag.  Place a towel between your skin and the bag.  Leave the ice on for 20 minutes, 2-3 times a day.  When the low back starts healing, stretching and strengthening exercises may be recommended. SEEK MEDICAL CARE IF:  Your back pain is getting worse.  You experience severe back pain not relieved with medicines. SEEK IMMEDIATE MEDICAL CARE IF:   You have numbness, tingling, weakness, or problems with the use of your arms or legs.  There is a change in bowel or bladder control.  You have increasing pain in any area of the body, including your belly (abdomen).  You notice shortness of breath, dizziness, or feel faint.  You feel sick to your  stomach (nauseous), are throwing up (vomiting), or become sweaty.  You notice discoloration of your toes or legs, or your feet get very cold. MAKE SURE YOU:   Understand these instructions.  Will watch your condition.  Will get help right away if you are not doing well or get worse.   This information is not intended to replace advice given to you by your health care provider. Make sure you discuss any questions you have with your health care provider.   Document Released: 01/06/2005 Document Revised: 04/19/2014 Document Reviewed: 11/15/2012 Elsevier Interactive Patient Education [employer].

## 2015-05-19 NOTE — Progress Notes (Signed)
   Subjective:    I'm seeing this patient as a consultation for:  Dr. Madilyn Fireman  CC: Back pain  HPI: Patient notes a several day history of severe left lower back pain. Pain is worse with twisting and motion. He denies any radiating pain weakness or numbness bowel bladder dysfunction. He has tried Advil and Aleve which have not helped. He denies any fevers chills nausea vomiting or diarrhea. He denies any injury.  Past medical history, Surgical history, Family history not pertinant except as noted below, Social history, Allergies, and medications have been entered into the medical record, reviewed, and no changes needed.   Review of Systems: No headache, visual changes, nausea, vomiting, diarrhea, constipation, dizziness, abdominal pain, skin rash, fevers, chills, night sweats, weight loss, swollen lymph nodes, body aches, joint swelling, muscle aches, chest pain, shortness of breath, mood changes, visual or auditory hallucinations.   Objective:    Filed Vitals:   05/19/15 1509  BP: 141/96  Pulse: 117   General: Well Developed, well nourished, and in no acute distress.  Neuro/Psych: Alert and oriented x3, extra-ocular muscles intact, able to move all 4 extremities, sensation grossly intact. Skin: Warm and dry, no rashes noted.  Respiratory: Not using accessory muscles, speaking in full sentences, trachea midline.  Cardiovascular: Pulses palpable, no extremity edema. Abdomen: Does not appear distended. MSK: Back is nontender to spinal midline. Tender palpation left quadratus lumborum. Low back motion is normal with the exception of lateral flexion which produces considerable pain to the left. Negative Corky Sox and FADIR Summary strength is intact throughout. Reflexes are equal and normal bilaterally. Hip normal-appearing and nontender bilaterally. Hip motion is limited especially in internal motion bilaterally to 10 but otherwise is normal. Normal gait.  No results found for this or  any previous visit (from the past 24 hour(s)). No results found.  Impression and Recommendations:   This case required medical decision making of moderate complexity.

## 2015-05-19 NOTE — Assessment & Plan Note (Signed)
Symptoms consistent with lumbosacral strain especially the quadratus lumborum. Plan for physical therapy Flexeril and naproxen. Recheck in a few weeks. Work note provided.

## 2017-06-28 ENCOUNTER — Encounter: Payer: Self-pay | Admitting: Family Medicine

## 2017-06-28 ENCOUNTER — Ambulatory Visit (INDEPENDENT_AMBULATORY_CARE_PROVIDER_SITE_OTHER): Payer: BLUE CROSS/BLUE SHIELD | Admitting: Family Medicine

## 2017-06-28 VITALS — BP 124/77 | HR 109 | Ht 72.0 in | Wt 201.0 lb

## 2017-06-28 DIAGNOSIS — K2100 Gastro-esophageal reflux disease with esophagitis, without bleeding: Secondary | ICD-10-CM

## 2017-06-28 DIAGNOSIS — R0683 Snoring: Secondary | ICD-10-CM

## 2017-06-28 MED ORDER — PANTOPRAZOLE SODIUM 40 MG PO TBEC: 40.0000 mg | DELAYED_RELEASE_TABLET | Freq: Every day | ORAL | 5 refills | Status: DC

## 2017-06-28 NOTE — Progress Notes (Signed)
Subjective:    Patient ID: Anthony Morgan, male    DOB: [birth year], 47 y.o.   MRN: [id number]  HPI 69-year-old male with a history of depression, anxiety and insomnia comes in today to discuss recent sleep concerns.  He is worried that he may actually have sleep apnea.  He says his wife has said that he is been gurgling in his sleep and sometimes gasping for air.  He says he is been a loud snorer for years.  He says he is also occasionally been woken up and will start gagging and coughing though he does have a history of reflux as well.  Occasionally he will take a PPI like pantoprazole which does seem to provide some relief.  He does frequently wake up with morning headaches and feels like some days he is more groggy at work.  He does try to get 6-7 hours of sleep per night.   GERD - drinks coffee in the morning, soda at work, tea,juice at home, does not eat spicy/greasy foods. would like a refill for acid reflux medication  Review of Systems  BP 124/77   Pulse (!) 109   Ht 6' (1.829 m)   Wt 201 lb (91.2 kg)   SpO2 97%   BMI 27.26 kg/m     No Known Allergies  Past Medical History:  Diagnosis Date  . Anxiety     Past Surgical History:  Procedure Laterality Date  . TONSILECTOMY, ADENOIDECTOMY, BILATERAL MYRINGOTOMY AND TUBES  1992  . TONSILLECTOMY  1992    Social History   Socioeconomic History  . Marital status: Married    Spouse name: Not on file  . Number of children: 1  . Years of education: Not on file  . Highest education level: Not on file  Social Needs  . Financial resource strain: Not on file  . Food insecurity - worry: Not on file  . Food insecurity - inability: Not on file  . Transportation needs - medical: Not on file  . Transportation needs - non-medical: Not on file  Occupational History  . Occupation: TECH/MACH OPERATOR    Employer: [employer]  Tobacco Use  . Smoking status: Current Every Day Smoker    Packs/day: 1.00    Years: 25.00    Pack  years: 25.00    Types: Cigarettes  . Smokeless tobacco: Never Used  Substance and Sexual Activity  . Alcohol use: No  . Drug use: Yes    Comment: Patient reports that he stopped 3 months.  . Sexual activity: No    Comment: .  Other Topics Concern  . Not on file  Social History Narrative   Daughter is Allard Lightsey and she is also a patient here. Separated from his wife.     Family History  Problem Relation Age of Onset  . Hypertension Father   . Hyperlipidemia Father   . Stroke Unknown        grandmother  . Cancer Unknown        grandfather/bladder ca  . Cancer Mother        breast  . Cancer Maternal Grandmother     Outpatient Encounter Medications as of 06/28/2017  Medication Sig  . pantoprazole (PROTONIX) 40 MG tablet Take 1 tablet (40 mg total) by mouth daily.  . [DISCONTINUED] cyclobenzaprine (FLEXERIL) 10 MG tablet Take 1 tablet (10 mg total) by mouth 3 (three) times daily as needed for muscle spasms.  . [DISCONTINUED] naproxen (NAPROSYN) 500 MG tablet Take  1 tablet (500 mg total) by mouth 2 (two) times daily with a meal.  . [DISCONTINUED] pantoprazole (PROTONIX) 40 MG tablet Take 1 tablet (40 mg total) by mouth daily.   No facility-administered encounter medications on file as of 06/28/2017.          Objective:   Physical Exam  Constitutional: He is oriented to person, place, and time. He appears well-developed and well-nourished.  HENT:  Head: Normocephalic and atraumatic.  Pharynx is clear.  He does have a little bit of a smaller open airway.  But no significant enlargement of tonsils or the uvula.  Neck: Neck supple. No thyromegaly present.  Cardiovascular: Normal rate, regular rhythm and normal heart sounds.  Pulmonary/Chest: Effort normal and breath sounds normal.  Lymphadenopathy:    He has no cervical adenopathy.  Neurological: He is alert and oriented to person, place, and time.  Skin: Skin is warm and dry.  Psychiatric: He has a normal mood and  affect. His behavior is normal.        Assessment & Plan:  Snoring-stop bang questionnaire score of 4.  He has multiple symptoms consistent with obstructive sleep apnea-we discussed options including an in lab sleep study versus a home sleep study.  He would like to try for the in lab study at least initially but I did not let him know that sometimes insurance will determine which one we end up ordering.  We will try to move forward with this.  GERD-we will give a handout with some dietary measures to reduce reflux.  And certainly if he needs to take a PPI intermittently than that is perfectly fine as well.  It does sound like some of the choking and gagging he is getting at night could be reflux related.

## 2017-06-28 NOTE — Patient Instructions (Addendum)
Food Choices for Gastroesophageal Reflux Disease, Adult When you have gastroesophageal reflux disease (GERD), the foods you eat and your eating habits are very important. Choosing the right foods can help ease your discomfort. What guidelines do I need to follow?  Choose fruits, vegetables, whole grains, and low-fat dairy products.  Choose low-fat meat, fish, and poultry.  Limit fats such as oils, salad dressings, butter, nuts, and avocado.  Keep a food diary. This helps you identify foods that cause symptoms.  Avoid foods that cause symptoms. These may be different for everyone.  Eat small meals often instead of 3 large meals a day.  Eat your meals slowly, in a place where you are relaxed.  Limit fried foods.  Cook foods using methods other than frying.  Avoid drinking alcohol.  Avoid drinking large amounts of liquids with your meals.  Avoid bending over or lying down until 2-3 hours after eating. What foods are not recommended? These are some foods and drinks that may make your symptoms worse: Vegetables  Tomatoes. Tomato juice. Tomato and spaghetti sauce. Chili peppers. Onion and garlic. Horseradish. Fruits  Oranges, grapefruit, and lemon (fruit and juice). Meats  High-fat meats, fish, and poultry. This includes hot dogs, ribs, ham, sausage, salami, and bacon. Dairy  Whole milk and chocolate milk. Sour cream. Cream. Butter. Ice cream. Cream cheese. Drinks  Coffee and tea. Bubbly (carbonated) drinks or energy drinks. Condiments  Hot sauce. Barbecue sauce. Sweets/Desserts  Chocolate and cocoa. Donuts. Peppermint and spearmint. Fats and Oils  High-fat foods. This includes French fries and potato chips. Other  Vinegar. Strong spices. This includes black pepper, white pepper, red pepper, cayenne, curry powder, cloves, ginger, and chili powder. The items listed above may not be a complete list of foods and drinks to avoid. Contact your dietitian for more information.    This information is not intended to replace advice given to you by your health care provider. Make sure you discuss any questions you have with your health care provider. Document Released: 09/28/2011 Document Revised: 09/04/2015 Document Reviewed: 01/31/2013 Elsevier Interactive Patient Education  2017 Elsevier Inc.  

## 2017-07-19 ENCOUNTER — Ambulatory Visit (HOSPITAL_BASED_OUTPATIENT_CLINIC_OR_DEPARTMENT_OTHER): Payer: BLUE CROSS/BLUE SHIELD | Attending: Family Medicine | Admitting: Internal Medicine

## 2017-07-19 VITALS — Ht 71.5 in | Wt 200.0 lb

## 2017-07-19 DIAGNOSIS — G4733 Obstructive sleep apnea (adult) (pediatric): Secondary | ICD-10-CM | POA: Diagnosis not present

## 2017-07-19 DIAGNOSIS — R5383 Other fatigue: Secondary | ICD-10-CM | POA: Insufficient documentation

## 2017-07-19 DIAGNOSIS — F513 Sleepwalking [somnambulism]: Secondary | ICD-10-CM | POA: Insufficient documentation

## 2017-07-19 DIAGNOSIS — R0683 Snoring: Secondary | ICD-10-CM | POA: Diagnosis not present

## 2017-07-23 DIAGNOSIS — R0683 Snoring: Secondary | ICD-10-CM

## 2017-07-23 NOTE — Procedures (Signed)
    Patient Name: Anthony Morgan, Anthony Morgan Date: 07/19/2017 Gender: Male D.O.B: Sep 06, 1970 Age (years): 46 Referring Provider: Beatrice Lecher Height (inches): 72 Interpreting Physician: Baird Lyons MD, ABSM Weight (lbs): 200 RPSGT: Laren Everts BMI: 28 MRN: [id number] Neck Size: 16.00  CLINICAL INFORMATION Sleep Study Type: NPSG Indication for sleep study: Fatigue, Sleep walking/talking/parasomnias, Snoring, Witnessed Apneas Epworth Sleepiness Score: 9  SLEEP STUDY TECHNIQUE As per the AASM Manual for the Scoring of Sleep and Associated Events v2.3 (April 2016) with a hypopnea requiring 4% desaturations.  The channels recorded and monitored were frontal, central and occipital EEG, electrooculogram (EOG), submentalis EMG (chin), nasal and oral airflow, thoracic and abdominal wall motion, anterior tibialis EMG, snore microphone, electrocardiogram, and pulse oximetry.  MEDICATIONS Medications self-administered by patient taken the night of the study : none reported  SLEEP ARCHITECTURE The study was initiated at 10:51:19 PM and ended at 5:00:52 AM.  Sleep onset time was 30.5 minutes and the sleep efficiency was 79.2%%. The total sleep time was 292.5 minutes.  Stage REM latency was 28.5 minutes.  The patient spent 10.6%% of the night in stage N1 sleep, 63.4%% in stage N2 sleep, 0.0%% in stage N3 and 25.98% in REM.  Alpha intrusion was absent.  Supine sleep was 29.23%.  RESPIRATORY PARAMETERS The overall apnea/hypopnea index (AHI) was 9.8 per hour. There were 3 total apneas, including 2 obstructive, 1 central and 0 mixed apneas. There were 45 hypopneas and 38 RERAs.  The AHI during Stage REM sleep was 15.8 per hour.  AHI while supine was 25.3 per hour.  The mean oxygen saturation was 91.4%. The minimum SpO2 during sleep was 72.0%.  moderate snoring was noted during this study.  CARDIAC DATA The 2 lead EKG demonstrated sinus rhythm. The mean heart rate was 79.5 beats  per minute. Other EKG findings include: None.  LEG MOVEMENT DATA The total PLMS were 0 with a resulting PLMS index of 0.0. Associated arousal with leg movement index was 0.4 .  IMPRESSIONS - Mild obstructive sleep apnea occurred during this study (AHI = 9.8/h). - No significant central sleep apnea occurred during this study (CAI = 0.2/h). - Moderate oxygen desaturation was noted during this study (Min O2 = 72.0%). Mean 91.4% - The patient snored with moderate snoring volume. - No cardiac abnormalities were noted during this study. - Clinically significant periodic limb movements did not occur during sleep. No significant associated arousals.  DIAGNOSIS - Obstructive Sleep Apnea (327.23 [G47.33 ICD-10])  RECOMMENDATIONS - Treatment of mild OSA is directed at symptoms. If conservative measures are insufficient, consider CPAP or a fitted oral appliance. Other options would be guided by clinical judgment. - Positional therapy avoiding supine position during sleep. - Be careful with alcohol, sedatives and other CNS depressants that may worsen sleep apnea and disrupt normal sleep architecture. - Sleep hygiene should be reviewed to assess factors that may improve sleep quality. - Weight management and regular exercise should be initiated or continued if appropriate.  [Electronically signed] 07/23/2017 04:10 PM  Baird Lyons MD, [place], American Board of Sleep Medicine   NPI: [id number]                         [place], [place] [facility]  ELECTRONICALLY SIGNED ON:  07/23/2017, 4:08 PM [place] PH: [phone]   FX: [phone] [place]

## 2017-07-26 ENCOUNTER — Telehealth: Payer: Self-pay

## 2017-08-26 NOTE — Telephone Encounter (Signed)
No additional encounter notes.

## 2017-09-02 ENCOUNTER — Encounter: Payer: Self-pay | Admitting: Family Medicine

## 2017-09-02 ENCOUNTER — Ambulatory Visit (INDEPENDENT_AMBULATORY_CARE_PROVIDER_SITE_OTHER): Payer: BLUE CROSS/BLUE SHIELD | Admitting: Family Medicine

## 2017-09-02 VITALS — BP 115/75 | HR 93 | Ht 72.0 in | Wt 199.0 lb

## 2017-09-02 DIAGNOSIS — G4733 Obstructive sleep apnea (adult) (pediatric): Secondary | ICD-10-CM | POA: Diagnosis not present

## 2017-09-02 DIAGNOSIS — L821 Other seborrheic keratosis: Secondary | ICD-10-CM | POA: Diagnosis not present

## 2017-09-02 MED ORDER — AMBULATORY NON FORMULARY MEDICATION: 0 refills | Status: DC

## 2017-09-02 NOTE — Progress Notes (Signed)
Subjective:    Patient ID: Anthony Morgan, male    DOB: 09/26/1970, 47 y.o.   MRN: [id number]  HPI 47 year old male is here today to discuss his recent sleep study results.  He does have mild obstructive sleep apnea based on the in lab study.  Did not sleep long enough to have the split part of the study.  Apnea hypotony index was 9.8.  He is not overweight.  He did not have any periodic limb movement noted.  His oxygen did drop to about 71% during the study.  Snoring was moderate.  He has a skin lesion on the left side of his scalp that he would like me look at today.  He said he thought initially it was a scab but it seemed to come back.  It is really not painful or bothersome he just wants to make sure it is not a tumor  Review of Systems  BP 115/75   Pulse 93   Ht 6' (1.829 m)   Wt 199 lb (90.3 kg)   SpO2 98%   BMI 26.99 kg/m     No Known Allergies  Past Medical History:  Diagnosis Date  . Anxiety     Past Surgical History:  Procedure Laterality Date  . TONSILECTOMY, ADENOIDECTOMY, BILATERAL MYRINGOTOMY AND TUBES  1992  . TONSILLECTOMY  1992    Social History   Socioeconomic History  . Marital status: Married    Spouse name: Not on file  . Number of children: 1  . Years of education: Not on file  . Highest education level: Not on file  Occupational History  . Occupation: TECH/MACH OPERATOR    Employer: [employer]  Social Needs  . Financial resource strain: Not on file  . Food insecurity:    Worry: Not on file    Inability: Not on file  . Transportation needs:    Medical: Not on file    Non-medical: Not on file  Tobacco Use  . Smoking status: Current Every Day Smoker    Packs/day: 1.00    Years: 25.00    Pack years: 25.00    Types: Cigarettes  . Smokeless tobacco: Never Used  Substance and Sexual Activity  . Alcohol use: No  . Drug use: Yes    Comment: Patient reports that he stopped 3 months.  . Sexual activity: Never    Comment: .  Lifestyle   . Physical activity:    Days per week: Not on file    Minutes per session: Not on file  . Stress: Not on file  Relationships  . Social connections:    Talks on phone: Not on file    Gets together: Not on file    Attends religious service: Not on file    Active member of club or organization: Not on file    Attends meetings of clubs or organizations: Not on file    Relationship status: Not on file  . Intimate partner violence:    Fear of current or ex partner: Not on file    Emotionally abused: Not on file    Physically abused: Not on file    Forced sexual activity: Not on file  Other Topics Concern  . Not on file  Social History Narrative   Daughter is Eryx Zane and she is also a patient here. Separated from his wife.     Family History  Problem Relation Age of Onset  . Hypertension Father   . Hyperlipidemia Father   .  Stroke Unknown        grandmother  . Cancer Unknown        grandfather/bladder ca  . Cancer Mother        breast  . Cancer Maternal Grandmother     Outpatient Encounter Medications as of 09/02/2017  Medication Sig  . pantoprazole (PROTONIX) 40 MG tablet Take 1 tablet (40 mg total) by mouth daily.  . AMBULATORY NON FORMULARY MEDICATION Medication Name: CPAP set to autopap 4-18 cm water pressure.  Dx. Mild OSA with AHI of 9.8,  Fax to [facility].   No facility-administered encounter medications on file as of 09/02/2017.          Objective:   Physical Exam  Constitutional: He is oriented to person, place, and time. He appears well-developed and well-nourished.  HENT:  Head: Normocephalic and atraumatic.  Eyes: Conjunctivae and EOM are normal.  Cardiovascular: Normal rate.  Pulmonary/Chest: Effort normal.  Neurological: He is alert and oriented to person, place, and time.  Skin: Skin is dry. No pallor.  Psychiatric: He has a normal mood and affect. His behavior is normal.  Vitals reviewed.      Assessment & Plan:  Obstructive sleep apnea-New  Dx. discussed and reviewed diagnosis.  Discussed potential complications of untreated sleep apnea and benefits of treatment.  He would be a good candidate for CPAP or an oral appliance.  Recommend a trial of CPAP first.  We will send order to DME supplier.  We will set to [facility] and then have them do a download after 1 week so that we can set his pressure.  Once he has been using it for 1 month I would like to see him back to make sure that he is doing well, is compliant and troubleshoot any problems he is having.  Seborrheic keratosis-likely benign gave reassurance.  We can perform cryotherapy if he would like it removed at any point.  He says he will keep an eye on it and let me know if he feels like is getting larger.

## 2018-02-04 ENCOUNTER — Emergency Department (INDEPENDENT_AMBULATORY_CARE_PROVIDER_SITE_OTHER): Payer: BLUE CROSS/BLUE SHIELD

## 2018-02-04 ENCOUNTER — Emergency Department (INDEPENDENT_AMBULATORY_CARE_PROVIDER_SITE_OTHER)
Admission: EM | Admit: 2018-02-04 | Discharge: 2018-02-04 | Disposition: A | Discharge status: Home / Self Care | Payer: BLUE CROSS/BLUE SHIELD | Source: Home / Self Care | Attending: Family Medicine | Admitting: Family Medicine

## 2018-02-04 ENCOUNTER — Other Ambulatory Visit: Payer: Self-pay

## 2018-02-04 DIAGNOSIS — R109 Unspecified abdominal pain: Secondary | ICD-10-CM

## 2018-02-04 DIAGNOSIS — R1013 Epigastric pain: Secondary | ICD-10-CM

## 2018-02-04 LAB — POCT URINALYSIS DIP (MANUAL ENTRY)
BILIRUBIN UA: NEGATIVE
Blood, UA: NEGATIVE
Glucose, UA: NEGATIVE mg/dL
Ketones, POC UA: NEGATIVE mg/dL
LEUKOCYTES UA: NEGATIVE
Nitrite, UA: NEGATIVE
PH UA: 7.5 (ref 5.0–8.0)
Protein Ur, POC: NEGATIVE mg/dL
Spec Grav, UA: 1.02 (ref 1.010–1.025)
Urobilinogen, UA: 0.2 E.U./dL

## 2018-02-04 LAB — POCT CBC W AUTO DIFF (K'VILLE URGENT CARE)

## 2018-02-04 MED ORDER — HYDROCODONE-ACETAMINOPHEN 5-325 MG PO TABS: 1.0000 | ORAL_TABLET | Freq: Four times a day (QID) | ORAL | 0 refills | Status: DC | PRN

## 2018-02-04 MED ORDER — CIPROFLOXACIN HCL 500 MG PO TABS: ORAL_TABLET | ORAL | 0 refills | Status: DC

## 2018-02-04 MED ORDER — METRONIDAZOLE 500 MG PO TABS: ORAL_TABLET | ORAL | 0 refills | Status: DC

## 2018-02-04 NOTE — Discharge Instructions (Addendum)
Begin clear liquids for about 24 hours, then may begin a BRAT diet (Bananas, Rice, Applesauce, Toast) when abdominal pain resolved.  Then gradually advance to a regular diet as tolerated.     If symptoms become significantly worse during the night or over the weekend, proceed to the local emergency room.

## 2018-02-04 NOTE — ED Triage Notes (Signed)
Pt c/o severe stomach pain that started today while at work. Describes as a stabbing, cramping feeling. Pain level 9/10. Had a normal BM earlier today.

## 2018-02-04 NOTE — ED Provider Notes (Signed)
[name]    CSN: [id number] Arrival date & time: 02/04/18  1527     History   Chief Complaint Chief Complaint  Patient presents with  . Abdominal Pain    HPI Anthony Morgan is a 47 y.o. male.   Patient ate lunch about 11:30am today.  20 to 30 minutes later (about 5 hours ago) he suddenly developed sharp, pulsating mid-abdominal pain, associated with sweats, without nausea/vomiting.  The pain decreased after about 10 minutes but has persisted all day.  The pain does not radiate, and he cannot find a comfortable position.  He had a normal bowel movement today, and states that his bowel movements have been normal prior today. He has GERD, for which he takes Protonix daily, but he states that he has never had similar pain with his GERD.   Abdominal Pain  Pain location:  Epigastric and LLQ Pain quality: sharp and throbbing   Pain radiates to:  Does not radiate Pain severity:  Severe Onset quality:  Sudden Duration:  5 hours Timing:  Constant Progression:  Improving Chronicity:  New Context: eating   Context: not alcohol use, not diet changes, not laxative use, not previous surgeries, not recent illness, not recent travel, not sick contacts, not suspicious food intake and not trauma   Relieved by:  Nothing Worsened by:  Nothing Ineffective treatments:  None tried Associated symptoms: anorexia   Associated symptoms: no belching, no chest pain, no chills, no constipation, no cough, no diarrhea, no dysuria, no fatigue, no fever, no flatus, no hematemesis, no hematochezia, no hematuria, no melena, no nausea, no shortness of breath and no vomiting   Risk factors: no alcohol abuse and no NSAID use     Past Medical History:  Diagnosis Date  . Anxiety     Patient Active Problem List   Diagnosis Date Noted  . OSA (obstructive sleep apnea) 09/02/2017  . Seborrheic keratoses 09/02/2017  . Lumbosacral strain 05/19/2015  . GAD (generalized anxiety disorder) 12/09/2011   . BASAL CELL CARCINOMA SKIN OTHER & UNS PARTS FACE 04/02/2010  . Major depression, single episode 12/04/2009  . INSOMNIA 12/04/2009  . HYPERTRIGLYCERIDEMIA 12/21/2007  . ELEVATED BLOOD PRESSURE WITHOUT DIAGNOSIS OF HYPERTENSION 09/19/2007    Past Surgical History:  Procedure Laterality Date  . TONSILECTOMY, ADENOIDECTOMY, BILATERAL MYRINGOTOMY AND TUBES  1992  . TONSILLECTOMY  1992       Home Medications    Prior to Admission medications   Medication Sig Start Date End Date Taking? Authorizing Provider  AMBULATORY NON FORMULARY MEDICATION Medication Name: CPAP set to autopap 4-18 cm water pressure.  Dx. Mild OSA with AHI of 9.8,  Fax to [facility]. 09/02/17   [name], MD  ciprofloxacin (CIPRO) 500 MG tablet Take one tab by mouth every 12 hours 02/04/18   Kandra Nicolas, MD  HYDROcodone-acetaminophen (NORCO/VICODIN) 5-325 MG tablet Take 1 tablet by mouth every 6 (six) hours as needed for moderate pain or severe pain. 02/04/18   Kandra Nicolas, MD  metroNIDAZOLE (FLAGYL) 500 MG tablet Take one tab by mouth every 8 hours 02/04/18   Kandra Nicolas, MD  pantoprazole (PROTONIX) 40 MG tablet Take 1 tablet (40 mg total) by mouth daily. 06/28/17   [name], MD    Family History Family History  Problem Relation Age of Onset  . Hypertension Father   . Hyperlipidemia Father   . Stroke Unknown        grandmother  . Cancer Unknown  grandfather/bladder ca  . Cancer Mother        breast  . Cancer Maternal Grandmother     Social History Social History   Tobacco Use  . Smoking status: Current Every Day Smoker    Packs/day: 1.00    Years: 25.00    Pack years: 25.00    Types: Cigarettes  . Smokeless tobacco: Never Used  Substance Use Topics  . Alcohol use: No  . Drug use: Yes    Comment: Patient reports that he stopped 3 months.     Allergies   Patient has no known allergies.   Review of Systems Review of Systems  Constitutional: Positive  for activity change and appetite change. Negative for chills, diaphoresis, fatigue and fever.  HENT: Negative.   Eyes: Negative.   Respiratory: Negative for cough and shortness of breath.   Cardiovascular: Negative for chest pain.  Gastrointestinal: Positive for abdominal pain and anorexia. Negative for constipation, diarrhea, flatus, hematemesis, hematochezia, melena, nausea and vomiting.  Genitourinary: Negative for dysuria and hematuria.  Musculoskeletal: Negative.   Skin: Negative.   Neurological: Negative.      Physical Exam Triage Vital Signs ED Triage Vitals  Enc Vitals Group     BP 02/04/18 1547 (!) 145/96     Pulse Rate 02/04/18 1547 89     Resp --      Temp 02/04/18 1547 98.4 F (36.9 C)     Temp Source 02/04/18 1547 Oral     SpO2 02/04/18 1547 97 %     Weight 02/04/18 1548 205 lb (93 kg)     Height 02/04/18 1548 5\' 11"  (1.803 m)     Head Circumference --      Peak Flow --      Pain Score 02/04/18 1548 9     Pain Loc --      Pain Edu? --      Excl. in [place]? --    No data found.  Updated Vital Signs BP (!) 145/96 (BP Location: Right Arm)   Pulse 89   Temp 98.4 F (36.9 C) (Oral)   Ht 5\' 11"  (1.803 m)   Wt 93 kg   SpO2 97%   BMI 28.59 kg/m   Visual Acuity Right Eye Distance:   Left Eye Distance:   Bilateral Distance:    Right Eye Near:   Left Eye Near:    Bilateral Near:     Physical Exam  Constitutional: He appears well-developed and well-nourished. He does not appear ill.  HENT:  Head: Normocephalic.  Right Ear: External ear normal.  Left Ear: External ear normal.  Nose: Nose normal.  Mouth/Throat: Oropharynx is clear and moist.  Eyes: Pupils are equal, round, and reactive to light. Conjunctivae are normal.  Neck: Neck supple.  Cardiovascular: Normal heart sounds.  Pulmonary/Chest: Breath sounds normal.  Abdominal: Soft. Normal appearance and bowel sounds are normal. He exhibits no mass. There is no hepatosplenomegaly. There is generalized  tenderness and tenderness in the epigastric area. There is no rigidity, no rebound, no guarding, no CVA tenderness, no tenderness at McBurney's point and negative Murphy's sign.    Musculoskeletal: He exhibits no edema.  Lymphadenopathy:    He has no cervical adenopathy.  Neurological: He is alert.  Skin: Skin is warm and dry. No rash noted.  Nursing note and vitals reviewed.    UC Treatments / Results  Labs (all labs ordered are listed, but only abnormal results are displayed) Labs Reviewed  POCT URINALYSIS DIP (  MANUAL ENTRY) - Abnormal; Notable for the following components:      Result Value   Clarity, UA cloudy (*)    All other components within normal limits  COMPLETE METABOLIC PANEL WITH GFR  AMYLASE  LIPASE  POCT CBC W AUTO DIFF ([facility]):  WBC 12.6; LY 16.8; M243O 3.6; GR 79.6; Hgb 15.7; Platelets      EKG None  Radiology Dg Abdomen 1 View  Result Date: 02/04/2018 CLINICAL DATA:  Sudden onset diffuse abdominal pain after eating 5 hours ago. EXAM: ABDOMEN - 1 VIEW COMPARISON:  None. FINDINGS: Nonobstructed, nondistended bowel gas pattern with moderate stool retention from cecum through distal transverse colon. No free air, organomegaly, radio-opaque calculi or other significant radiographic abnormality is seen. IMPRESSION: Increased stool retention within the colon. Nonobstructed, nondistended bowel gas pattern. Electronically Signed   By: Ashley Royalty M.D.   On: 02/04/2018 17:26    Procedures Procedures (including critical care time)  Medications Ordered in UC Medications - No data to display  Initial Impression / Assessment and Plan / UC Course  I have reviewed the triage vital signs and the nursing notes.  Pertinent labs & imaging results that were available during my care of the patient were reviewed by me and considered in my medical decision making (see chart for details).    Note mild leukocytosis (WBC 12.6). ?diverticulitis.  Begin empiric  Cipro and Flagyl. Rx for Lortab (#15, no refill) Controlled Substance Prescriptions I have consulted the [place] Controlled Substances Registry for this patient, and feel the risk/benefit ratio today is favorable for proceeding with this prescription for a controlled substance.   CMP, amylase, and lipase pending. Followup with Family Doctor in 7 hours   Final Clinical Impressions(s) / UC Diagnoses   Final diagnoses:  Epigastric pain     Discharge Instructions     Begin clear liquids for about 24 hours, then may begin a BRAT diet (Bananas, Rice, Applesauce, Toast) when abdominal pain resolved.  Then gradually advance to a regular diet as tolerated.     If symptoms become significantly worse during the night or over the weekend, proceed to the local emergency room.     ED Prescriptions    Medication Sig Dispense Auth. Provider   ciprofloxacin (CIPRO) 500 MG tablet Take one tab by mouth every 12 hours 14 tablet Kandra Nicolas, MD   metroNIDAZOLE (FLAGYL) 500 MG tablet Take one tab by mouth every 8 hours 21 tablet Kandra Nicolas, MD   HYDROcodone-acetaminophen (NORCO/VICODIN) 5-325 MG tablet Take 1 tablet by mouth every 6 (six) hours as needed for moderate pain or severe pain. 15 tablet Kandra Nicolas, MD        Kandra Nicolas, MD 02/05/18 [phone]

## 2018-02-05 ENCOUNTER — Telehealth: Payer: Self-pay | Admitting: Emergency Medicine

## 2018-02-05 LAB — COMPLETE METABOLIC PANEL WITH GFR
AG Ratio: 2.3 (calc) (ref 1.0–2.5)
ALBUMIN MSPROF: 5 g/dL (ref 3.6–5.1)
ALKALINE PHOSPHATASE (APISO): 92 U/L (ref 40–115)
ALT: 49 U/L — AB (ref 9–46)
AST: 27 U/L (ref 10–40)
BUN: 15 mg/dL (ref 7–25)
CO2: 29 mmol/L (ref 20–32)
CREATININE: 0.89 mg/dL (ref 0.60–1.35)
Calcium: 10.2 mg/dL (ref 8.6–10.3)
Chloride: 99 mmol/L (ref 98–110)
GFR, Est African American: 118 mL/min/{1.73_m2} (ref >=60)
GFR, Est Non African American: 102 mL/min/{1.73_m2} (ref >=60)
Globulin: 2.2 g/dL (calc) (ref 1.9–3.7)
Glucose, Bld: 101 mg/dL — ABNORMAL HIGH (ref 65–99)
Potassium: 4.7 mmol/L (ref 3.5–5.3)
Sodium: 139 mmol/L (ref 135–146)
Total Bilirubin: 0.3 mg/dL (ref 0.2–1.2)
Total Protein: 7.2 g/dL (ref 6.1–8.1)

## 2018-02-05 LAB — AMYLASE: AMYLASE: 37 U/L (ref 21–101)

## 2018-02-05 LAB — LIPASE: LIPASE: 19 U/L (ref 7–60)

## 2018-02-05 NOTE — Telephone Encounter (Signed)
Patient states pain much improved; having questions about how to take meds; had one spell of N & V but was taking both meds at same time on essentially empty stomach. He will make appt with PCP tomorrow for follow up;

## 2018-02-06 ENCOUNTER — Ambulatory Visit (INDEPENDENT_AMBULATORY_CARE_PROVIDER_SITE_OTHER): Payer: BLUE CROSS/BLUE SHIELD | Admitting: Family Medicine

## 2018-02-06 ENCOUNTER — Encounter: Payer: Self-pay | Admitting: Family Medicine

## 2018-02-06 ENCOUNTER — Ambulatory Visit (INDEPENDENT_AMBULATORY_CARE_PROVIDER_SITE_OTHER): Payer: BLUE CROSS/BLUE SHIELD

## 2018-02-06 ENCOUNTER — Telehealth: Payer: Self-pay | Admitting: Family Medicine

## 2018-02-06 VITALS — BP 112/76 | HR 128 | Temp 98.8°F | Ht 71.5 in | Wt 200.0 lb

## 2018-02-06 DIAGNOSIS — K429 Umbilical hernia without obstruction or gangrene: Secondary | ICD-10-CM | POA: Diagnosis not present

## 2018-02-06 DIAGNOSIS — R1084 Generalized abdominal pain: Secondary | ICD-10-CM

## 2018-02-06 DIAGNOSIS — N261 Atrophy of kidney (terminal): Secondary | ICD-10-CM

## 2018-02-06 DIAGNOSIS — K769 Liver disease, unspecified: Secondary | ICD-10-CM | POA: Diagnosis not present

## 2018-02-06 DIAGNOSIS — K760 Fatty (change of) liver, not elsewhere classified: Secondary | ICD-10-CM

## 2018-02-06 MED ORDER — IOPAMIDOL (ISOVUE-300) INJECTION 61%
100.0000 mL | Freq: Once | INTRAVENOUS | Status: AC | PRN
  Administered 2018-02-06: 100 mL via INTRAVENOUS

## 2018-02-06 NOTE — Telephone Encounter (Signed)
Call patient with results of his CT scan of his abdomen and pelvis.  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  I spoke with on-call person at vascular surgery in [facility] who will work patient in the clinic tomorrow.  He will arrive at [facility] in [place] at [address] tomorrow morning for work and.  This is reasonable as long as he is clinically doing reasonably well which he was earlier today and he denies any worsening pain. Plan for continued clear liquid diet continued antibiotics and follow-up with vascular surgery tomorrow.  Discussed precautions if worsening go to [facility] emergency room.

## 2018-02-06 NOTE — Progress Notes (Signed)
Anthony Morgan is a 47 y.o. male who presents to [facility] today for abdominal pain.  Patient developed abdominal pain earlier this week and was seen in urgent care on Saturday, October 26.  He was thought to have possibly diverticulitis and was treated empirically with Cipro Flagyl.  He had mild leukocytosis during that visit.  He is here today taking Cipro Flagyl feeling somewhat better.  Notes continued abdominal pain now worse in the right lower quadrant and continued mildly elevated heart rate.  He denies current fevers or chills.  He one episode of vomiting yesterday.  He denies any blood in the stool or vomit.  Denies urinary symptoms.   ROS as above:  Exam:  BP 112/76   Pulse (!) 128   Temp 98.8 F (37.1 C) (Oral)   Ht 5' 11.5" (1.816 m)   Wt 200 lb (90.7 kg)   BMI 27.51 kg/m  Wt Readings from Last 5 Encounters:  02/06/18 200 lb (90.7 kg)  02/04/18 205 lb (93 kg)  09/02/17 199 lb (90.3 kg)  07/19/17 200 lb (90.7 kg)  06/28/17 201 lb (91.2 kg)    Gen: Well NAD HEENT: EOMI,  MMM Lungs: Normal work of breathing. CTABL Heart: Mild tachycardia regular rhythm no MRG Abd: NABS, Soft. Nondistended, tender to palpation right lower quadrant with rebound and guarding.  No masses palpated. Negative psoas sign. Exts: Brisk capillary refill, warm and well perfused.   Lab and Radiology Results Results for orders placed or performed during the hospital encounter of 02/04/18 (from the past 72 hour(s))  POCT CBC w auto diff     Status: None   Collection Time: 02/04/18  5:01 PM  Result Value Ref Range   WBC      Comment: see scanned report   Lymphocytes relative %     Monocytes relative %     Neutrophils relative % (GR)     Lymphocytes absolute     Monocyes absolute     Neutrophils absolute (GR#)     RBC     Hemoglobin     Hematocrit     MCV     MCH     MCHC     RDW     Platelet count     MPV    POCT urinalysis dipstick     Status: Abnormal   Collection Time: 02/04/18  5:25 PM  Result Value Ref Range   Color, UA yellow yellow   Clarity, UA cloudy (A) clear   Glucose, UA negative negative mg/dL   Bilirubin, UA negative negative   Ketones, POC UA negative negative mg/dL   Spec Grav, UA 1.020 1.010 - 1.025   Blood, UA negative negative   pH, UA 7.5 5.0 - 8.0   Protein Ur, POC negative negative mg/dL   Urobilinogen, UA 0.2 0.2 or 1.0 E.U./dL   Nitrite, UA Negative Negative   Leukocytes, UA Negative Negative  COMPLETE METABOLIC PANEL WITH GFR     Status: Abnormal   Collection Time: 02/04/18  5:52 PM  Result Value Ref Range   Glucose, Bld 101 (H) 65 - 99 mg/dL    Comment: .            Fasting reference interval . For someone without known diabetes, a glucose value between 100 and 125 mg/dL is consistent with prediabetes and should be confirmed with a follow-up test. .    BUN 15 7 -  25 mg/dL   Creat 0.89 0.60 - 1.35 mg/dL   GFR, Est Non African American 102 > OR = 60 mL/min/1.3m2   GFR, Est African American 118 > OR = 60 mL/min/1.5m2   BUN/Creatinine Ratio NOT APPLICABLE 6 - 22 (calc)   Sodium 139 135 - 146 mmol/L   Potassium 4.7 3.5 - 5.3 mmol/L   Chloride 99 98 - 110 mmol/L   CO2 29 20 - 32 mmol/L   Calcium 10.2 8.6 - 10.3 mg/dL   Total Protein 7.2 6.1 - 8.1 g/dL   Albumin 5.0 3.6 - 5.1 g/dL   Globulin 2.2 1.9 - 3.7 g/dL (calc)   AG Ratio 2.3 1.0 - 2.5 (calc)   Total Bilirubin 0.3 0.2 - 1.2 mg/dL   Alkaline phosphatase (APISO) 92 40 - 115 U/L   AST 27 10 - 40 U/L   ALT 49 (H) 9 - 46 U/L  Amylase     Status: None   Collection Time: 02/04/18  5:52 PM  Result Value Ref Range   Amylase 37 21 - 101 U/L  Lipase     Status: None   Collection Time: 02/04/18  5:52 PM  Result Value Ref Range   Lipase 19 7 - 60 U/L   Dg Abdomen 1 View  Result Date: 02/04/2018 CLINICAL DATA:  Sudden onset diffuse abdominal pain after  eating 5 hours ago. EXAM: ABDOMEN - 1 VIEW COMPARISON:  None. FINDINGS: Nonobstructed, nondistended bowel gas pattern with moderate stool retention from cecum through distal transverse colon. No free air, organomegaly, radio-opaque calculi or other significant radiographic abnormality is seen. IMPRESSION: Increased stool retention within the colon. Nonobstructed, nondistended bowel gas pattern. Electronically Signed   By: Ashley Royalty M.D.   On: 02/04/2018 17:26      Assessment and Plan: 47 y.o. male with right lower quadrant abdominal pain slightly improved but not fully improving with Cipro Flagyl.  This is concerning for now appendicitis rather than diverticulitis.  Given he still symptomatic and now has right lower quadrant abdominal pain I think CT scan is the next step.  Plan for CT scan of the abdomen and pelvis with contrast.  Will determine treatment based on CT scan results.  If resolving diverticulitis will treat conservatively with continued antibiotics.  Precautions reviewed with patient.  Await stat CT scan abdomen pelvis results.   Orders Placed This Encounter  Procedures  . CT Abdomen Pelvis W Contrast    Standing Status:   Future    Standing Expiration Date:   05/10/2019    Order Specific Question:   If indicated for the ordered procedure, I authorize the administration of contrast media per Radiology protocol    Answer:   Yes    Order Specific Question:   Preferred imaging location?    Answer:   [facility]    Order Specific Question:   Is Oral Contrast requested for this exam?    Answer:   Yes, Per Radiology protocol    Order Specific Question:   Radiology Contrast Protocol - do NOT remove file path    Answer:   [email or web address]   No orders of the defined types were placed in this encounter.    Historical information moved to improve visibility of documentation.  Past Medical History:  Diagnosis Date  . Anxiety    Past Surgical  History:  Procedure Laterality Date  . TONSILECTOMY, ADENOIDECTOMY, BILATERAL MYRINGOTOMY AND TUBES  1992  . TONSILLECTOMY  1992   Social History   Tobacco  Use  . Smoking status: Current Every Day Smoker    Packs/day: 1.00    Years: 25.00    Pack years: 25.00    Types: Cigarettes  . Smokeless tobacco: Never Used  Substance Use Topics  . Alcohol use: No   family history includes Cancer in his maternal grandmother, mother, and unknown relative; Hyperlipidemia in his father; Hypertension in his father; Stroke in his unknown relative.  Medications: Current Outpatient Medications  Medication Sig Dispense Refill  . AMBULATORY NON FORMULARY MEDICATION Medication Name: CPAP set to autopap 4-18 cm water pressure.  Dx. Mild OSA with AHI of 9.8,  [facility]ax to [facility]. 1 vial 0  . ciprofloxacin (CIPRO) 500 MG tablet Take one tab by mouth every 12 hours 14 tablet 0  . HYDROcodone-acetaminophen (NORCO/VICODIN) 5-325 MG tablet Take 1 tablet by mouth every 6 (six) hours as needed for moderate pain or severe pain. 15 tablet 0  . metroNIDAZOLE (FLAGYL) 500 MG tablet Take one tab by mouth every 8 hours 21 tablet 0  . pantoprazole (PROTONIX) 40 MG tablet Take 1 tablet (40 mg total) by mouth daily. 30 tablet 5   No current facility-administered medications for this visit.    No Known Allergies   Discussed warning signs or symptoms. Please see discharge instructions. Patient expresses understanding.

## 2018-02-06 NOTE — Telephone Encounter (Signed)
Mr.Zatarain called wanting to make sure that when you call with his scan results that his cell phone is the number that you call. [phone].

## 2018-02-06 NOTE — Patient Instructions (Signed)
Thank you for coming in today. Call or go to the ER if you develop a large red swollen joint with extreme pain or oozing puss.  Get CT scan now.    Appendicitis The appendix is a tube that is shaped like a finger. It is connected to the large intestine. Appendicitis means that this tube is swollen (inflamed). Without treatment, the tube can tear (rupture). This can lead to a life-threatening infection. It can also cause you to have sores (abscesses). These sores hurt. What are the causes? This condition may be caused by something that blocks the appendix, such as:  A ball of poop (stool).  Lymph glands that are bigger than normal.  Sometimes, the cause is not known. What are the signs or symptoms? Symptoms of this condition include:  Pain around the belly button (navel). ? The pain moves toward the lower right belly (abdomen). ? The pain can get worse with time. ? The pain can get worse if you cough. ? The pain can get worse if you move suddenly.  Tenderness in the lower right belly.  Feeling sick to your stomach (nauseous).  Throwing up (vomiting).  Not feeling hungry (loss of appetite).  A fever.  Having a hard time pooping (constipation).  Watery poop (diarrhea).  Not feeling well.  How is this treated? Usually, this condition is treated by taking out the appendix (appendectomy). There are two ways that the appendix can be taken out:  Open surgery. In this surgery, the appendix is taken out through a large cut (incision). The cut is made in the lower right belly. This surgery may be picked if: ? You have scars from another surgery. ? You have a bleeding condition. ? You are pregnant and will be having your baby soon. ? You have a condition that does not allow the other type of surgery.  Laparoscopic surgery. In this surgery, the appendix is taken out through small cuts. Often, this surgery: ? Causes less pain. ? Causes fewer problems. ? Is easier to heal  from.  If your appendix tears and a sore forms:  A drain may be put into the sore. The drain will be used to get rid of fluid.  You may get an antibiotic medicine through an IV tube.  Your appendix may or may not need to be taken out.  This information is not intended to replace advice given to you by your health care provider. Make sure you discuss any questions you have with your health care provider. Document Released: 06/21/2011 Document Revised: 09/04/2015 Document Reviewed: 08/14/2014 Elsevier Interactive Patient Education  [employer].

## 2018-02-07 ENCOUNTER — Other Ambulatory Visit: Payer: Self-pay

## 2018-02-07 ENCOUNTER — Ambulatory Visit (INDEPENDENT_AMBULATORY_CARE_PROVIDER_SITE_OTHER): Payer: BLUE CROSS/BLUE SHIELD | Admitting: Vascular Surgery

## 2018-02-07 ENCOUNTER — Encounter: Payer: Self-pay | Admitting: Family Medicine

## 2018-02-07 DIAGNOSIS — I7779 Dissection of other artery: Secondary | ICD-10-CM

## 2018-02-07 DIAGNOSIS — I776 Arteritis, unspecified: Secondary | ICD-10-CM | POA: Insufficient documentation

## 2018-02-07 DIAGNOSIS — N261 Atrophy of kidney (terminal): Secondary | ICD-10-CM

## 2018-02-07 DIAGNOSIS — K769 Liver disease, unspecified: Secondary | ICD-10-CM | POA: Insufficient documentation

## 2018-02-07 DIAGNOSIS — K760 Fatty (change of) liver, not elsewhere classified: Secondary | ICD-10-CM | POA: Insufficient documentation

## 2018-02-07 DIAGNOSIS — K429 Umbilical hernia without obstruction or gangrene: Secondary | ICD-10-CM | POA: Insufficient documentation

## 2018-02-07 HISTORY — DX: Atrophy of kidney (terminal): N26.1

## 2018-02-07 NOTE — Progress Notes (Signed)
Patient name: Anthony Morgan MRN: [id number] DOB: 11/23/1970 Sex: male  REASON FOR CONSULT: SMA dissection  HPI: Anthony Morgan is a 47 y.o. 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  Patient reports on Saturday approximately 3 days ago he developed sudden acute abdominal pain.  He states initially the pain was more severe in his right lower quadrant and was ultimately seen at urgent care where he was placed on antibiotics with concern for diverticulitis.  He represented and a CT scan was obtained with concern for pathology in his celiac and SMA. On presentation today he states his abdominal pain is completely resolved.  He denies any nausea or vomiting.  He states he was taking Cipro Flagyl and that this helped his symptoms.  He had no fevers or chills at home.  No recent infections. No family hx of vasculitis.  No hx of dissections.  He does smoke cigarettes. No previous abdominal surgery.   Past Medical History:  Diagnosis Date  . Anxiety   . Renal atrophy, right 02/07/2018   Seen on CT scan October 2019    Past Surgical History:  Procedure Laterality Date  . TONSILECTOMY, ADENOIDECTOMY, BILATERAL MYRINGOTOMY AND TUBES  1992  . TONSILLECTOMY  1992    Family History  Problem Relation Age of Onset  . Hypertension Father   . Hyperlipidemia Father   . Stroke Unknown        grandmother  . Cancer Unknown        grandfather/bladder ca  . Cancer Mother        breast  . Cancer Maternal Grandmother     SOCIAL HISTORY: Social History   Socioeconomic History  . Marital status: Married    Spouse name: Not on file  . Number of children: 1  . Years of education: Not on file  . Highest education level: Not on file  Occupational History  . Occupation: TECH/MACH OPERATOR    Employer: [employer]  Social Needs  . Financial resource strain: Not on file  .  Food insecurity:    Worry: Not on file    Inability: Not on file  . Transportation needs:    Medical: Not on file    Non-medical: Not on file  Tobacco Use  . Smoking status: Current Every Day Smoker    Packs/day: 1.00    Years: 25.00    Pack years: 25.00    Types: Cigarettes  . Smokeless tobacco: Never Used  Substance and Sexual Activity  . Alcohol use: No  . Drug use: Yes    Comment: Patient reports that he stopped 3 months.  . Sexual activity: Never    Comment: .  Lifestyle  . Physical activity:    Days per week: Not on file    Minutes per session: Not on file  . Stress: Not on file  Relationships  . Social connections:    Talks on phone: Not on file    Gets together: Not on file    Attends religious service: Not on file    Active member of club or organization: Not on file    Attends meetings of clubs or organizations: Not on file    Relationship status: Not on file  . Intimate partner violence:    Fear of current or ex partner: Not on file    Emotionally abused: Not on  file    Physically abused: Not on file    Forced sexual activity: Not on file  Other Topics Concern  . Not on file  Social History Narrative   Daughter is Anthony Morgan and she is also a patient here. Separated from his wife.     No Known Allergies  Current Outpatient Medications  Medication Sig Dispense Refill  . ciprofloxacin (CIPRO) 500 MG tablet Take one tab by mouth every 12 hours 14 tablet 0  . metroNIDAZOLE (FLAGYL) 500 MG tablet Take one tab by mouth every 8 hours 21 tablet 0  . pantoprazole (PROTONIX) 40 MG tablet Take 1 tablet (40 mg total) by mouth daily. 30 tablet 5  . AMBULATORY NON FORMULARY MEDICATION Medication Name: CPAP set to autopap 4-18 cm water pressure.  Dx. Mild OSA with AHI of 9.8,  Fax to [facility]. (Patient not taking: Reported on 02/07/2018) 1 vial 0  . HYDROcodone-acetaminophen (NORCO/VICODIN) 5-325 MG tablet Take 1 tablet by mouth every 6 (six) hours as needed for  moderate pain or severe pain. (Patient not taking: Reported on 02/07/2018) 15 tablet 0   No current facility-administered medications for this visit.     REVIEW OF SYSTEMS:  [X]  denotes positive finding, [ ]  denotes negative finding Cardiac  Comments:  Chest pain or chest pressure:    Shortness of breath upon exertion:    Short of breath when lying flat:    Irregular heart rhythm:        Vascular    Pain in calf, thigh, or hip brought on by ambulation:    Pain in feet at night that wakes you up from your sleep:     Blood clot in your veins:    Leg swelling:         Pulmonary    Oxygen at home:    Productive cough:     Wheezing:         Neurologic    Sudden weakness in arms or legs:     Sudden numbness in arms or legs:     Sudden onset of difficulty speaking or slurred speech:    Temporary loss of vision in one eye:     Problems with dizziness:         Gastrointestinal    Blood in stool:     Vomited blood:         Genitourinary    Burning when urinating:     Blood in urine:        Psychiatric    Major depression:         Hematologic    Bleeding problems:    Problems with blood clotting too easily:        Skin    Rashes or ulcers:        Constitutional    Fever or chills:      PHYSICAL EXAM: Vitals:   02/07/18 0907  BP: (!) 137/93  Pulse: (!) 107  Resp: 18  Temp: 98 F (36.7 C)  TempSrc: Oral  SpO2: 99%  Weight: 90.3 kg  Height: 5' 11.5" (1.816 m)    GENERAL: The patient is a well-nourished male, in no acute distress. The vital signs are documented above. CARDIAC: There is a regular rate and rhythm.  VASCULAR:  2+ radial pulse palpable bilateral upper extremities 2+ femoral pulse palpable bilateral groins 2+ DP/PT palpable bilateral lower extremities. PULMONARY: There is good air exchange bilaterally without wheezing or rales. ABDOMEN: Soft and non-tender with normal pitched  bowel sounds.  No rebound or guarding.  MUSCULOSKELETAL: There are no  major deformities or cyanosis. NEUROLOGIC: No focal weakness or paresthesias are detected. SKIN: There are no ulcers or rashes noted. PSYCHIATRIC: The patient has a normal affect.  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  He also has some stranding around his superior mesenteric artery in the midportion of the artery with a possible focal defect that is hard to evaluate distally near major branch point.   Assessment/Plan:  Discussed with Mr. 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  He has no pain on evaluation today and overall looks good.  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  I advised him to start taking an 81 mg aspirin daily now and given the concern for focal celiac dissection as well as possible additional pathology in his SMA I will  obtain a dedicated CTA abdomen and pelvis to better evaluate.  Discussed if he continues to do well with no symptoms we will need to follow him with a repeat CTA in 6 months to ensure he does not have any aneurysmal degeneration, etc.  We will also refer him to rheumatology for work-up of vasculitis.  I will contact him after CTA to review results.  Discussed if he has repeat episode of abrupt abdominal pain he needs to go to ED for evaluation.     Marty Heck, MD [facility] Office: [phone] Pager: [place]

## 2018-02-08 NOTE — Telephone Encounter (Signed)
Will do. Anthony Morgan,CMA

## 2018-02-11 ENCOUNTER — Ambulatory Visit
Admission: RE | Admit: 2018-02-11 | Discharge: 2018-02-11 | Disposition: A | Discharge status: Home / Self Care | Payer: BLUE CROSS/BLUE SHIELD | Source: Ambulatory Visit | Attending: Vascular Surgery | Admitting: Vascular Surgery

## 2018-02-11 DIAGNOSIS — K429 Umbilical hernia without obstruction or gangrene: Secondary | ICD-10-CM | POA: Diagnosis not present

## 2018-02-11 DIAGNOSIS — I7779 Dissection of other artery: Secondary | ICD-10-CM

## 2018-02-11 MED ORDER — IOPAMIDOL (ISOVUE-370) INJECTION 76%
75.0000 mL | Freq: Once | INTRAVENOUS | Status: AC | PRN
  Administered 2018-02-11: 75 mL via INTRAVENOUS

## 2018-02-16 DIAGNOSIS — I776 Arteritis, unspecified: Secondary | ICD-10-CM | POA: Diagnosis not present

## 2018-02-16 DIAGNOSIS — G4730 Sleep apnea, unspecified: Secondary | ICD-10-CM | POA: Diagnosis not present

## 2018-02-16 DIAGNOSIS — R1013 Epigastric pain: Secondary | ICD-10-CM | POA: Diagnosis not present

## 2018-03-02 DIAGNOSIS — R1013 Epigastric pain: Secondary | ICD-10-CM | POA: Diagnosis not present

## 2018-03-02 DIAGNOSIS — I776 Arteritis, unspecified: Secondary | ICD-10-CM | POA: Diagnosis not present

## 2018-06-07 DIAGNOSIS — I776 Arteritis, unspecified: Secondary | ICD-10-CM | POA: Diagnosis not present

## 2018-06-07 DIAGNOSIS — R1013 Epigastric pain: Secondary | ICD-10-CM | POA: Diagnosis not present

## 2018-06-09 ENCOUNTER — Other Ambulatory Visit: Payer: Self-pay | Admitting: Rheumatology

## 2018-06-09 DIAGNOSIS — I776 Arteritis, unspecified: Secondary | ICD-10-CM

## 2018-06-30 ENCOUNTER — Ambulatory Visit
Admission: RE | Admit: 2018-06-30 | Discharge: 2018-06-30 | Disposition: A | Discharge status: Home / Self Care | Payer: BLUE CROSS/BLUE SHIELD | Source: Ambulatory Visit | Attending: Rheumatology | Admitting: Rheumatology

## 2018-06-30 DIAGNOSIS — I776 Arteritis, unspecified: Secondary | ICD-10-CM | POA: Diagnosis not present

## 2018-06-30 MED ORDER — IOPAMIDOL (ISOVUE-370) INJECTION 76%
75.0000 mL | Freq: Once | INTRAVENOUS | Status: AC | PRN
  Administered 2018-06-30: 75 mL via INTRAVENOUS

## 2018-07-16 ENCOUNTER — Other Ambulatory Visit: Payer: Self-pay | Admitting: Family Medicine

## 2018-08-15 ENCOUNTER — Ambulatory Visit: Payer: Self-pay | Admitting: Vascular Surgery

## 2018-08-22 ENCOUNTER — Encounter: Payer: Self-pay | Admitting: Family

## 2018-08-22 ENCOUNTER — Ambulatory Visit: Payer: Self-pay | Admitting: Vascular Surgery

## 2018-09-12 ENCOUNTER — Other Ambulatory Visit: Payer: Self-pay | Admitting: Vascular Surgery

## 2018-09-12 DIAGNOSIS — I7779 Dissection of other artery: Secondary | ICD-10-CM

## 2018-10-02 ENCOUNTER — Inpatient Hospital Stay: Admission: RE | Admit: 2018-10-02 | Payer: BLUE CROSS/BLUE SHIELD | Source: Ambulatory Visit

## 2018-10-09 ENCOUNTER — Telehealth: Payer: Self-pay | Admitting: *Deleted

## 2018-10-10 ENCOUNTER — Encounter: Payer: Self-pay | Admitting: Vascular Surgery

## 2018-10-10 ENCOUNTER — Other Ambulatory Visit: Payer: Self-pay

## 2018-10-10 ENCOUNTER — Ambulatory Visit (INDEPENDENT_AMBULATORY_CARE_PROVIDER_SITE_OTHER): Payer: BC Managed Care – PPO | Admitting: Vascular Surgery

## 2018-10-10 VITALS — BP 120/80 | Ht 71.5 in | Wt 200.0 lb

## 2018-10-10 DIAGNOSIS — I7779 Dissection of other artery: Secondary | ICD-10-CM | POA: Diagnosis not present

## 2018-10-10 NOTE — Progress Notes (Signed)
   Virtual Visit via Video Note   I connected with Otilio Miu on 10/10/2018 using the [employer] virtual platform and verified that I was speaking with the correct person using two identifiers. Patient was located in his car and accompanied by himseelf. I am located at [facility] office on [facility].   The limitations of evaluation and management by telemedicine and the availability of in person appointments have been previously discussed with the patient and are documented in the patients chart. The patient expressed understanding and consented to proceed.   PCP: [name], MD  Chief Complaint: 6 month follow-up, repeat CTA, celiac artery dissection  History of Present Illness: SHERRY ROGUS is a 48 y.o. male that presents for 43-month follow-up for evaluation of celiac artery dissection.  I initially saw him back in October 2019 after he had acute onset abdominal pain and a CT scan that showed a distal celiac artery dissection as well as some stranding and possible vasculitis around the SMA.  All of his pain resolved back in October and during my initial evaluation he was completely asymptomatic.  On follow-up today he endorses no new abdominal pain nausea vomiting.  States otherwise been feeling fine.  Had repeat CTA in March.  States he did see Dr. Estanislado Pandy with rheumatology and was not started on any additional medications.  Past Medical History:  Diagnosis Date  . Anxiety   . Renal atrophy, right 02/07/2018   Seen on CT scan October 2019    Past Surgical History:  Procedure Laterality Date  . TONSILECTOMY, ADENOIDECTOMY, BILATERAL MYRINGOTOMY AND TUBES  1992  . TONSILLECTOMY  1992    Current Meds  Medication Sig  . pantoprazole (PROTONIX) 40 MG tablet TAKE 1 TABLET BY MOUTH EVERY DAY    12 system ROS was negative unless otherwise noted in HPI  Observations/Objective: Sitting in car on phone during video visit, no distress  CTA 07/01/2018: Stable dissection in the distal  celiac artery that does not appear flow-limiting.  Significantly less inflammation in the SMA.  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  Fortunately he has remained asymptomatic over the last 6 months with no new abdominal or otherwise symptoms concerning for this pathology.  I reviewed his repeat CTA at 6 months and the celiac dissection looks stable and is not flow-limiting.  In addition there is less inflammation around the SMA.  Radiology comments on a small focal aneurysm in the SMA but its fairly unimpressive.  Discussed we will continue to follow this with another CTA in 1 year.  He should remain on aspirin.  Follow Up Instructions:  Follow up: Repeat CTA in 1 year   I discussed the assessment and treatment plan with the patient. The patient was provided an opportunity to ask questions and all were answered. The patient agreed with the plan and demonstrated an understanding of the instructions.   The patient was advised to call back or seek an in-person evaluation if the symptoms worsen or if the condition fails to improve as anticipated.  I spent 15 minutes with the patient via video encounter.   Signed, Marty Heck [facility] Office: [phone]  10/10/2018, 12:11 PM

## 2019-11-05 ENCOUNTER — Other Ambulatory Visit: Payer: Self-pay | Admitting: Family Medicine

## 2019-11-07 ENCOUNTER — Other Ambulatory Visit: Payer: Self-pay

## 2019-11-07 MED ORDER — PANTOPRAZOLE SODIUM 40 MG PO TBEC: 40.0000 mg | DELAYED_RELEASE_TABLET | Freq: Every day | ORAL | 0 refills | Status: DC

## 2019-11-07 NOTE — Telephone Encounter (Signed)
Patient schedule for a follow up appointment.

## 2019-11-14 ENCOUNTER — Other Ambulatory Visit: Payer: Self-pay

## 2019-11-14 ENCOUNTER — Ambulatory Visit (INDEPENDENT_AMBULATORY_CARE_PROVIDER_SITE_OTHER): Payer: BC Managed Care – PPO | Admitting: Family Medicine

## 2019-11-14 ENCOUNTER — Encounter: Payer: Self-pay | Admitting: Family Medicine

## 2019-11-14 VITALS — BP 139/89 | HR 104 | Ht 72.0 in | Wt 198.0 lb

## 2019-11-14 DIAGNOSIS — E781 Pure hyperglyceridemia: Secondary | ICD-10-CM

## 2019-11-14 DIAGNOSIS — H1031 Unspecified acute conjunctivitis, right eye: Secondary | ICD-10-CM

## 2019-11-14 DIAGNOSIS — Z13220 Encounter for screening for lipoid disorders: Secondary | ICD-10-CM

## 2019-11-14 DIAGNOSIS — K219 Gastro-esophageal reflux disease without esophagitis: Secondary | ICD-10-CM | POA: Diagnosis not present

## 2019-11-14 MED ORDER — OFLOXACIN 0.3 % OP SOLN
2.0000 [drp] | Freq: Four times a day (QID) | OPHTHALMIC | 0 refills | Status: DC
Start: 2019-11-14 — End: 2021-05-14

## 2019-11-14 MED ORDER — PANTOPRAZOLE SODIUM 40 MG PO TBEC: 40.0000 mg | DELAYED_RELEASE_TABLET | Freq: Every day | ORAL | 11 refills | Status: DC

## 2019-11-14 NOTE — Progress Notes (Signed)
Established Patient Office Visit  Subjective:  Patient ID: Anthony Morgan, male    DOB: 02-Mar-1971  Age: 49 y.o. MRN: [id number]  CC:  Chief Complaint  Patient presents with  . Gastroesophageal Reflux  . Stye    R eye    HPI Anthony Morgan presents for GERD. He does well on the protonix.    Says he ran out of medication. He does is calcium rich foods.  Does still drink soda.  He does try to avoid greasy and spicy foods.  Right eye was puffy and irritated last week. Says it felt sore and itchy and then noticed the lower lid was swollen.  He says that a family member gave him some old eyedrops that were ofloxacin he has started using this for the last couple days and actually feels like it has been helpful.  He is not sure how old the medication actually is.  No fevers chills or sweats or other upper respiratory symptoms.  Denies any vision changes.  Past Medical History:  Diagnosis Date  . Anxiety   . Renal atrophy, right 02/07/2018   Seen on CT scan October 2019    Past Surgical History:  Procedure Laterality Date  . TONSILECTOMY, ADENOIDECTOMY, BILATERAL MYRINGOTOMY AND TUBES  1992  . TONSILLECTOMY  1992    Family History  Problem Relation Age of Onset  . Hypertension Father   . Hyperlipidemia Father   . Stroke Other        grandmother  . Cancer Other        grandfather/bladder ca  . Cancer Mother        breast  . Cancer Maternal Grandmother     Social History   Socioeconomic History  . Marital status: Married    Spouse name: Not on file  . Number of children: 1  . Years of education: Not on file  . Highest education level: Not on file  Occupational History  . Occupation: TECH/MACH OPERATOR    Employer: [employer]  Tobacco Use  . Smoking status: Current Every Day Smoker    Packs/day: 1.00    Years: 25.00    Pack years: 25.00    Types: Cigarettes  . Smokeless tobacco: Never Used  Vaping Use  . Vaping Use: Never used  Substance and Sexual Activity   . Alcohol use: No  . Drug use: Yes    Comment: Patient reports that he stopped 3 months.  . Sexual activity: Never    Comment: .  Other Topics Concern  . Not on file  Social History Narrative   Daughter is Odas Ozer and she is also a patient here. Separated from his wife.    Social Determinants of Health   Financial Resource Strain:   . Difficulty of Paying Living Expenses:   Food Insecurity:   . Worried About Charity fundraiser in the Last Year:   . Arboriculturist in the Last Year:   Transportation Needs:   . Film/video editor (Medical):   Marland Kitchen Lack of Transportation (Non-Medical):   Physical Activity:   . Days of Exercise per Week:   . Minutes of Exercise per Session:   Stress:   . Feeling of Stress :   Social Connections:   . Frequency of Communication with Friends and Family:   . Frequency of Social Gatherings with Friends and Family:   . Attends Religious Services:   . Active Member of Clubs or Organizations:   .  Attends Archivist Meetings:   Marland Kitchen Marital Status:   Intimate Partner Violence:   . Fear of Current or Ex-Partner:   . Emotionally Abused:   Marland Kitchen Physically Abused:   . Sexually Abused:     Outpatient Medications Prior to Visit  Medication Sig Dispense Refill  . pantoprazole (PROTONIX) 40 MG tablet Take 1 tablet (40 mg total) by mouth daily. 15 tablet 0  . AMBULATORY NON FORMULARY MEDICATION Medication Name: CPAP set to autopap 4-18 cm water pressure.  Dx. Mild OSA with AHI of 9.8,  Fax to [facility]. (Patient not taking: Reported on 02/07/2018) 1 vial 0  . ciprofloxacin (CIPRO) 500 MG tablet Take one tab by mouth every 12 hours (Patient not taking: Reported on 10/10/2018) 14 tablet 0  . HYDROcodone-acetaminophen (NORCO/VICODIN) 5-325 MG tablet Take 1 tablet by mouth every 6 (six) hours as needed for moderate pain or severe pain. (Patient not taking: Reported on 02/07/2018) 15 tablet 0  . metroNIDAZOLE (FLAGYL) 500 MG tablet Take one tab by mouth  every 8 hours (Patient not taking: Reported on 10/10/2018) 21 tablet 0   No facility-administered medications prior to visit.    No Known Allergies  ROS Review of Systems    Objective:    Physical Exam Constitutional:      Appearance: He is well-developed.  HENT:     Head: Normocephalic and atraumatic.     Right Ear: External ear normal.     Left Ear: External ear normal.     Nose: Nose normal.  Eyes:     General:        Right eye: No discharge.        Left eye: No discharge.     Conjunctiva/sclera: Conjunctivae normal.     Pupils: Pupils are equal, round, and reactive to light.     Comments: The right lower lid does look a little bit swollen he does have looks like a little bit more localized swelling a little bit more medially at the edge of the lid.  No active discharge or drainage.  Cardiovascular:     Rate and Rhythm: Normal rate and regular rhythm.     Heart sounds: Normal heart sounds.  Pulmonary:     Effort: Pulmonary effort is normal.     Breath sounds: Normal breath sounds.  Skin:    General: Skin is warm and dry.  Neurological:     Mental Status: He is alert and oriented to person, place, and time.  Psychiatric:        Behavior: Behavior normal.     BP 139/89   Pulse (!) 104   Ht 6' (1.829 m)   Wt 198 lb (89.8 kg)   SpO2 98%   BMI 26.85 kg/m  Wt Readings from Last 3 Encounters:  11/14/19 198 lb (89.8 kg)  10/10/18 200 lb (90.7 kg)  02/07/18 199 lb (90.3 kg)     There are no preventive care reminders to display for this patient.  There are no preventive care reminders to display for this patient.  Lab Results  Component Value Date   TSH 1.231 04/04/2015   Lab Results  Component Value Date   WBC 7.9 04/04/2015   HGB 16.2 04/04/2015   HCT 46.7 04/04/2015   MCV 88.4 04/04/2015   PLT 222 04/04/2015   Lab Results  Component Value Date   NA 139 02/04/2018   K 4.7 02/04/2018   CO2 29 02/04/2018   GLUCOSE 101 (H) 02/04/2018  BUN 15  02/04/2018   CREATININE 0.89 02/04/2018   BILITOT 0.3 02/04/2018   ALKPHOS 95 09/19/2007   AST 27 02/04/2018   ALT 49 (H) 02/04/2018   PROT 7.2 02/04/2018   ALBUMIN 4.8 09/19/2007   CALCIUM 10.2 02/04/2018   Lab Results  Component Value Date   CHOL 268 (H) 04/04/2015   Lab Results  Component Value Date   HDL 41 04/04/2015   Lab Results  Component Value Date   LDLCALC NOT CALC 04/04/2015   Lab Results  Component Value Date   TRIG 423 (H) 04/04/2015   Lab Results  Component Value Date   CHOLHDL 6.5 (H) 04/04/2015   No results found for: HGBA1C    Assessment & Plan:   Problem List Items Addressed This Visit      Digestive   Gastroesophageal reflux disease without esophagitis - Primary    Continue with PPI.  Prescription sent to pharmacy for refills.  Did discuss increased risk for fractures with prolonged use I do to make sure he is taking an adequate calcium and vitamin D intake.      Relevant Medications   pantoprazole (PROTONIX) 40 MG tablet   Other Relevant Orders   COMPLETE METABOLIC PANEL WITH GFR   Lipid panel   CBC     Other   HYPERTRIGLYCERIDEMIA    Due to recheck lipids.  We will get updated blood work today.      Relevant Orders   Lipid panel    Other Visit Diagnoses    Screening, lipid       Relevant Orders   Lipid panel   Acute conjunctivitis of right eye, unspecified acute conjunctivitis type         Due for labs.   Acute conjunctivitis versus blepharitis-he seems to be improving significantly with the ofloxacin drops.  New prescription sent to pharmacy so he can actually complete a full course unsure if the ones that he has are expired.  If not continuing to improve and let me know.  Meds ordered this encounter  Medications  . pantoprazole (PROTONIX) 40 MG tablet    Sig: Take 1 tablet (40 mg total) by mouth daily.    Dispense:  30 tablet    Refill:  11  . ofloxacin (OCUFLOX) 0.3 % ophthalmic solution    Sig: Place 2 drops into the  right eye 4 (four) times daily.    Dispense:  5 mL    Refill:  0    Follow-up: Return in about 1 year (around 11/13/2020) for GERD.    Beatrice Lecher, MD

## 2019-11-14 NOTE — Assessment & Plan Note (Signed)
Continue with PPI.  Prescription sent to pharmacy for refills.  Did discuss increased risk for fractures with prolonged use I do to make sure he is taking an adequate calcium and vitamin D intake.

## 2019-11-14 NOTE — Assessment & Plan Note (Signed)
Due to recheck lipids.  We will get updated blood work today.

## 2019-11-15 DIAGNOSIS — E781 Pure hyperglyceridemia: Secondary | ICD-10-CM | POA: Diagnosis not present

## 2019-11-15 DIAGNOSIS — Z13220 Encounter for screening for lipoid disorders: Secondary | ICD-10-CM | POA: Diagnosis not present

## 2019-11-15 DIAGNOSIS — K219 Gastro-esophageal reflux disease without esophagitis: Secondary | ICD-10-CM | POA: Diagnosis not present

## 2019-11-15 LAB — COMPLETE METABOLIC PANEL WITH GFR
AG Ratio: 2.2 (calc) (ref 1.0–2.5)
ALT: 29 U/L (ref 9–46)
AST: 22 U/L (ref 10–40)
Albumin: 4.8 g/dL (ref 3.6–5.1)
Alkaline phosphatase (APISO): 81 U/L (ref 36–130)
BUN: 16 mg/dL (ref 7–25)
CO2: 29 mmol/L (ref 20–32)
Calcium: 9.5 mg/dL (ref 8.6–10.3)
Chloride: 100 mmol/L (ref 98–110)
Creat: 0.87 mg/dL (ref 0.60–1.35)
GFR, Est African American: 117 mL/min/{1.73_m2} (ref >=60)
GFR, Est Non African American: 101 mL/min/{1.73_m2} (ref >=60)
Globulin: 2.2 g/dL (calc) (ref 1.9–3.7)
Glucose, Bld: 84 mg/dL (ref 65–99)
Potassium: 4.3 mmol/L (ref 3.5–5.3)
Sodium: 138 mmol/L (ref 135–146)
Total Bilirubin: 0.5 mg/dL (ref 0.2–1.2)
Total Protein: 7 g/dL (ref 6.1–8.1)

## 2019-11-15 LAB — CBC
HCT: 44.3 % (ref 38.5–50.0)
Hemoglobin: 15.2 g/dL (ref 13.2–17.1)
MCH: 30.8 pg (ref 27.0–33.0)
MCHC: 34.3 g/dL (ref 32.0–36.0)
MCV: 89.7 fL (ref 80.0–100.0)
MPV: 10.2 fL (ref 7.5–12.5)
Platelets: 255 10*3/uL (ref 140–400)
RBC: 4.94 10*6/uL (ref 4.20–5.80)
RDW: 12.2 % (ref 11.0–15.0)
WBC: 10.3 10*3/uL (ref 3.8–10.8)

## 2019-11-15 LAB — LIPID PANEL
Cholesterol: 283 mg/dL — ABNORMAL HIGH (ref <200)
HDL: 44 mg/dL (ref >=40)
LDL Cholesterol (Calc): 184 mg/dL (calc) — ABNORMAL HIGH
Non-HDL Cholesterol (Calc): 239 mg/dL (calc) — ABNORMAL HIGH (ref <130)
Total CHOL/HDL Ratio: 6.4 (calc) — ABNORMAL HIGH (ref <5.0)
Triglycerides: 326 mg/dL — ABNORMAL HIGH (ref <150)

## 2019-11-16 MED ORDER — ATORVASTATIN CALCIUM 20 MG PO TABS
20.0000 mg | ORAL_TABLET | Freq: Every day | ORAL | 3 refills | Status: DC
Start: 2019-11-16 — End: 2021-05-14

## 2019-11-16 NOTE — Addendum Note (Signed)
Addended by: Beatrice Lecher D on: 11/16/2019 05:02 PM   Modules accepted: Orders

## 2019-11-21 ENCOUNTER — Other Ambulatory Visit: Payer: Self-pay | Admitting: Family Medicine

## 2019-11-21 DIAGNOSIS — K219 Gastro-esophageal reflux disease without esophagitis: Secondary | ICD-10-CM

## 2020-07-17 ENCOUNTER — Other Ambulatory Visit: Payer: Self-pay

## 2020-07-17 ENCOUNTER — Emergency Department (INDEPENDENT_AMBULATORY_CARE_PROVIDER_SITE_OTHER)
Admission: EM | Admit: 2020-07-17 | Discharge: 2020-07-17 | Disposition: A | Discharge status: Home / Self Care | Payer: BC Managed Care – PPO | Source: Home / Self Care | Attending: Emergency Medicine | Admitting: Emergency Medicine

## 2020-07-17 DIAGNOSIS — Z87828 Personal history of other (healed) physical injury and trauma: Secondary | ICD-10-CM

## 2020-07-17 MED ORDER — HYDROXYZINE HCL 25 MG PO TABS: 25.0000 mg | ORAL_TABLET | Freq: Four times a day (QID) | ORAL | 0 refills | Status: DC | PRN

## 2020-07-17 MED ORDER — MUPIROCIN 2 % EX OINT
1.0000 | TOPICAL_OINTMENT | Freq: Two times a day (BID) | CUTANEOUS | 0 refills | Status: DC
Start: 2020-07-17 — End: 2021-05-14

## 2020-07-17 NOTE — Discharge Instructions (Addendum)
I removed a small, nonengorged tick.  Keep it clean with soap and water.  Try Claritin or Zyrtec for the itching, if this does not work, then Atarax.  Bacitracin and Bactroban ointment.  Return here or see your doctor for fevers, body aches, rash, flulike illness, or for any other concerns in the next month.

## 2020-07-17 NOTE — ED Triage Notes (Signed)
Pt c/o itching around navel x 2 days. Says it started after working outside. Says he noticed a red bump in his navel. Some discharge noted. Denies any pain/tenderness.

## 2020-07-17 NOTE — ED Provider Notes (Signed)
HPI  SUBJECTIVE:  Anthony Morgan is a 50 y.o. male who presents with 2 days of pruritis at the top of his naval after working outside. He reports localized erythema, clear drainage and yellow crusting. No swelling, pain, fever, bodyaches, surrounding rash or rash elsewhere. No know exposure to poison ivy/poison oak.  No antipyretic in the past 6 hours.  He has tried an alcohol swabs and applying abx ointment without improvement in his symptoms.  No aggravating factors.  No history of MRSA, diabetes, hypertension, chronic kidney disease.  [id number], Rene Kocher, MD     Past Medical History:  Diagnosis Date  . Anxiety   . Renal atrophy, right 02/07/2018   Seen on CT scan October 2019    Past Surgical History:  Procedure Laterality Date  . TONSILECTOMY, ADENOIDECTOMY, BILATERAL MYRINGOTOMY AND TUBES  1992  . TONSILLECTOMY  1992    Family History  Problem Relation Age of Onset  . Hypertension Father   . Hyperlipidemia Father   . Stroke Other        grandmother  . Cancer Other        grandfather/bladder ca  . Cancer Mother        breast  . Cancer Maternal Grandmother     Social History   Tobacco Use  . Smoking status: Current Every Day Smoker    Packs/day: 1.00    Years: 25.00    Pack years: 25.00    Types: Cigarettes  . Smokeless tobacco: Never Used  Vaping Use  . Vaping Use: Never used  Substance Use Topics  . Alcohol use: No  . Drug use: Yes    Comment: Patient reports that he stopped 3 months.    No current facility-administered medications for this encounter.  Current Outpatient Medications:  .  hydrOXYzine (ATARAX/VISTARIL) 25 MG tablet, Take 1 tablet (25 mg total) by mouth every 6 (six) hours as needed for itching., Disp: 20 tablet, Rfl: 0 .  mupirocin ointment (BACTROBAN) 2 %, Apply 1 application topically 2 (two) times daily., Disp: 22 g, Rfl: 0 .  atorvastatin (LIPITOR) 20 MG tablet, Take 1 tablet (20 mg total) by mouth at bedtime., Disp: 90 tablet,  Rfl: 3 .  ofloxacin (OCUFLOX) 0.3 % ophthalmic solution, Place 2 drops into the right eye 4 (four) times daily., Disp: 5 mL, Rfl: 0 .  pantoprazole (PROTONIX) 40 MG tablet, Take 1 tablet (40 mg total) by mouth daily., Disp: 90 tablet, Rfl: 3  No Known Allergies   ROS  As noted in HPI.   Physical Exam  BP 130/86 (BP Location: Right Arm)   Pulse 86   Temp 98 F (36.7 C) (Oral)   Resp 17   SpO2 97%   Constitutional: Well developed, well nourished, no acute distress Eyes:  EOMI, conjunctiva normal bilaterally HENT: Normocephalic, atraumatic,mucus membranes moist Respiratory: Normal inspiratory effort Cardiovascular: Normal rate GI: Erythema, clear yellowish drainage at the top of the umbilicus.  No tenderness.         skin: No rash, skin intact Musculoskeletal: no deformities Neurologic: Alert & oriented x 3, no focal neuro deficits Psychiatric: Speech and behavior appropriate   ED Course   Medications - No data to display  No orders of the defined types were placed in this encounter.   No results found for this or any previous visit (from the past 24 hour(s)). No results found.  ED Clinical Impression  1. Tick bite with tick attached for 8 hours or longer  ED Assessment/Plan  On magnification, found a small, tan mass which I removed in its entirety with a pair of sharp tweezers.  It appears to be a nonengorged, but attached tick.  There is no surrounding erythema, edema, tenderness, rash.    Home with Claritin or Zyrtec, and if that does not work, Atarax.  Bacitracin and Bactroban.  Keep it clean with soap and water.  Return here or see PMD in the next 30 days for any body aches, fevers, rashes, flulike symptoms.  Discussed  MDM, treatment plan, and plan for follow-up with patient.patient agrees with plan.   Meds ordered this encounter  Medications  . mupirocin ointment (BACTROBAN) 2 %    Sig: Apply 1 application topically 2 (two) times daily.     Dispense:  22 g    Refill:  0  . hydrOXYzine (ATARAX/VISTARIL) 25 MG tablet    Sig: Take 1 tablet (25 mg total) by mouth every 6 (six) hours as needed for itching.    Dispense:  20 tablet    Refill:  0      *This clinic note was created using [employer]. Therefore, there may be occasional mistakes despite careful proofreading.  ?    Melynda Ripple, MD 07/18/20 [phone]

## 2020-08-12 DIAGNOSIS — U071 COVID-19: Secondary | ICD-10-CM | POA: Diagnosis not present

## 2020-08-12 DIAGNOSIS — Z20822 Contact with and (suspected) exposure to covid-19: Secondary | ICD-10-CM | POA: Diagnosis not present

## 2021-04-24 ENCOUNTER — Encounter: Payer: Self-pay | Admitting: Emergency Medicine

## 2021-04-24 ENCOUNTER — Emergency Department (INDEPENDENT_AMBULATORY_CARE_PROVIDER_SITE_OTHER)
Admission: EM | Admit: 2021-04-24 | Discharge: 2021-04-24 | Disposition: A | Discharge status: Home / Self Care | Payer: BC Managed Care – PPO | Source: Home / Self Care

## 2021-04-24 DIAGNOSIS — K122 Cellulitis and abscess of mouth: Secondary | ICD-10-CM

## 2021-04-24 DIAGNOSIS — J029 Acute pharyngitis, unspecified: Secondary | ICD-10-CM | POA: Diagnosis not present

## 2021-04-24 LAB — POCT RAPID STREP A (OFFICE): Rapid Strep A Screen: NEGATIVE

## 2021-04-24 MED ORDER — AMOXICILLIN-POT CLAVULANATE 875-125 MG PO TABS: 1.0000 | ORAL_TABLET | Freq: Two times a day (BID) | ORAL | 0 refills | Status: DC

## 2021-04-24 NOTE — ED Triage Notes (Signed)
Sore throat started on Tuesday  Coworker also went home sick  Pt's stepdaughter also had strep w/in the  last week  OTC - ibuprofen No fever  Tonsils removed 30 years ago - has had strep x 1 since then  No flu vaccine or COVID  vaccine  COVID 5/22

## 2021-04-24 NOTE — Discharge Instructions (Addendum)
Advised patient to take medication as directed with food to completion.  Encouraged patient increase daily water intake while taking this medication.  Advised patient we will follow-up with throat culture results once received.

## 2021-04-24 NOTE — ED Provider Notes (Signed)
[name]    CSN: [id number] Arrival date & time: 04/24/21  1521      History   Chief Complaint Chief Complaint  Patient presents with   Sore Throat    HPI Anthony Morgan is a 51 y.o. male.   HPI 51 year old male presents with sore throat for 3 days.  Reports coworker went home sick with strep throat as well his strep-daughter had strep throat last week.  Reports tonsils were removed 30 years ago has had strep pharyngitis once since.  PMH significant for vasculitis of mesenteric artery and BCC of face.  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 Medications    Prior to Admission medications   Medication Sig Start Date End Date Taking? Authorizing Provider  amoxicillin-clavulanate (AUGMENTIN) 875-125 MG tablet Take 1 tablet by mouth every 12 (twelve) hours. 04/24/21  Yes Eliezer Lofts, FNP  atorvastatin (LIPITOR) 20 MG tablet Take 1 tablet (20 mg total) by mouth at bedtime. Patient not taking: Reported on  04/24/2021 11/16/19   [name], MD  hydrOXYzine (ATARAX/VISTARIL) 25 MG tablet Take 1 tablet (25 mg total) by mouth every 6 (six) hours as needed for itching. Patient not taking: Reported on 04/24/2021 07/17/20   Melynda Ripple, MD  mupirocin ointment (BACTROBAN) 2 % Apply 1 application topically 2 (two) times daily. Patient not taking: Reported on 04/24/2021 07/17/20   Melynda Ripple, MD  ofloxacin (OCUFLOX) 0.3 % ophthalmic solution Place 2 drops into the right eye 4 (four) times daily. Patient not taking: Reported on 04/24/2021 11/14/19   [name], MD  pantoprazole (PROTONIX) 40 MG tablet Take 1 tablet (40 mg total) by mouth daily. Patient not taking: Reported on 04/24/2021 11/21/19   [name], MD    Family History Family History  Problem Relation Age of Onset   Cancer Mother        breast   Hypertension Father    Hyperlipidemia Father    Cancer Maternal Grandmother    Stroke Other        grandmother   Cancer Other        grandfather/bladder ca    Social History Social History   Tobacco Use   Smoking status: Former    Packs/day: 1.00    Years: 25.00    Pack years: 25.00    Types: Cigarettes    Quit date: 03/11/2021    Years since quitting: 0.1    Passive exposure: Never   Smokeless tobacco: Never  Vaping Use   Vaping Use:  Never used  Substance Use Topics   Alcohol use: No   Drug use: Not Currently    Comment: Patient reports that he stopped 3 months.     Allergies   Patient has no known allergies.   Review of Systems Review of Systems  HENT:  Positive for sore throat.   All other systems reviewed and are negative.   Physical Exam Triage Vital Signs ED Triage Vitals  Enc Vitals Group     BP 04/24/21 1533 123/86     Pulse Rate 04/24/21 1533 99     Resp 04/24/21 1533 16     Temp 04/24/21 1533 98.4 F (36.9 C)     Temp Source 04/24/21 1533 Oral     SpO2 04/24/21 1533 98 %     Weight 04/24/21 1536 190 lb (86.2 kg)      Height 04/24/21 1536 6' (1.829 m)     Head Circumference --      Peak Flow --      Pain Score 04/24/21 1535 4     Pain Loc --      Pain Edu? --      Excl. in [place]? --    No data found.  Updated Vital Signs BP 123/86 (BP Location: Right Arm)    Pulse 99    Temp 98.4 F (36.9 C) (Oral)    Resp 16    Ht 6' (1.829 m)    Wt 190 lb (86.2 kg)    SpO2 98%    BMI 25.77 kg/m   Physical Exam Vitals and nursing note reviewed.  Constitutional:      Appearance: He is well-developed and normal weight.  HENT:     Head: Normocephalic and atraumatic.     Right Ear: Tympanic membrane and ear canal normal.     Left Ear: Tympanic membrane and ear canal normal.     Mouth/Throat:     Mouth: Mucous membranes are moist.     Pharynx: Oropharynx is clear. Uvula midline. Posterior oropharyngeal erythema and uvula swelling present.  Eyes:     Conjunctiva/sclera: Conjunctivae normal.     Pupils: Pupils are equal, round, and reactive to light.  Cardiovascular:     Rate and Rhythm: Normal rate and regular rhythm.     Heart sounds: Normal heart sounds. No murmur heard. Pulmonary:     Effort: Pulmonary effort is normal.     Breath sounds: Normal breath sounds.  Musculoskeletal:     Cervical back: Normal range of motion and neck supple.  Skin:    General: Skin is warm and dry.  Neurological:     General: No focal deficit present.     Mental Status: He is alert and oriented to person, place, and time.     UC Treatments / Results  Labs (all labs ordered are listed, but only abnormal results are displayed) Labs Reviewed  CULTURE, GROUP A STREP  POCT RAPID STREP A (OFFICE)    EKG   Radiology No results found.  Procedures Procedures (including critical care time)  Medications Ordered in UC Medications - No data to display  Initial Impression / Assessment and Plan / UC Course  I have reviewed the triage vital signs and the nursing notes.  Pertinent labs & imaging results that were available  during my care of the patient were reviewed by me and considered in my medical decision making (see chart for details).     MDM: 1.  Uvulitis-Rx'd Augmentin; 2.  Acute viral  pharyngitis-strep negative, throat culture ordered. Advised patient to take medication as directed with food to completion.  Encouraged patient increase daily water intake while taking this medication.  Advised patient we will follow-up with throat culture results once received.  Patient discharged home, hemodynamically stable. Final Clinical Impressions(s) / UC Diagnoses   Final diagnoses:  Acute viral pharyngitis  Uvulitis     Discharge Instructions      Advised patient to take medication as directed with food to completion.  Encouraged patient increase daily water intake while taking this medication.  Advised patient we will follow-up with throat culture results once received.     ED Prescriptions     Medication Sig Dispense Auth. Provider   amoxicillin-clavulanate (AUGMENTIN) 875-125 MG tablet Take 1 tablet by mouth every 12 (twelve) hours. 14 tablet Eliezer Lofts, FNP      PDMP not reviewed this encounter.   Eliezer Lofts, [place] 04/24/21 1610

## 2021-04-27 LAB — CULTURE, GROUP A STREP: Strep A Culture: NEGATIVE

## 2021-05-14 ENCOUNTER — Encounter: Payer: Self-pay | Admitting: Family Medicine

## 2021-05-14 ENCOUNTER — Other Ambulatory Visit: Payer: Self-pay

## 2021-05-14 ENCOUNTER — Ambulatory Visit (INDEPENDENT_AMBULATORY_CARE_PROVIDER_SITE_OTHER): Payer: BC Managed Care – PPO | Admitting: Family Medicine

## 2021-05-14 VITALS — BP 130/94 | HR 110 | Resp 16 | Ht 72.0 in | Wt 185.0 lb

## 2021-05-14 DIAGNOSIS — E7849 Other hyperlipidemia: Secondary | ICD-10-CM

## 2021-05-14 DIAGNOSIS — J019 Acute sinusitis, unspecified: Secondary | ICD-10-CM

## 2021-05-14 DIAGNOSIS — Z1211 Encounter for screening for malignant neoplasm of colon: Secondary | ICD-10-CM

## 2021-05-14 DIAGNOSIS — R051 Acute cough: Secondary | ICD-10-CM | POA: Diagnosis not present

## 2021-05-14 DIAGNOSIS — R634 Abnormal weight loss: Secondary | ICD-10-CM

## 2021-05-14 MED ORDER — DOXYCYCLINE HYCLATE 100 MG PO TABS: 100.0000 mg | ORAL_TABLET | Freq: Two times a day (BID) | ORAL | 0 refills | Status: DC

## 2021-05-14 MED ORDER — HYDROCODONE BIT-HOMATROP MBR 5-1.5 MG/5ML PO SOLN: 5.0000 mL | Freq: Every evening | ORAL | 0 refills | Status: DC | PRN

## 2021-05-14 MED ORDER — ATORVASTATIN CALCIUM 20 MG PO TABS: 20.0000 mg | ORAL_TABLET | Freq: Every day | ORAL | 3 refills | Status: DC

## 2021-05-14 NOTE — Progress Notes (Signed)
Acute Office Visit  Subjective:    Patient ID: Anthony Morgan, male    DOB: Nov 11, 1970, 51 y.o.   MRN: [id number]  Chief Complaint  Patient presents with   Cough    Dry cough since 04/05/22, postnasal drainage, loss of appetite 3-4 days.     HPI Patient is in today for Cough. Cough actually started around Christmas after a cold.  He says overall he feels okay but the cough did persist.  He was seen initially at the urgent care on January 13 for sore throat.  He had had a positive exposure to strep.  He was treated for pharyngitis with Augmentin.  Strep culture ended up coming back negative.  He says the cough did get somewhat better after taking the Augmentin as well.  In the last 3 days has felt worse and feels like he is having more post nasal drainage and the cough is still there.  Feels like Left LN is swollen.  No shortness of breath.  He is seeing yellow and clear mucus.  Overall his sore throat is much better from a couple of weeks ago though it still bothers him a little bit first thing in the mornings and with the cough.  He feels like the cough is mostly from drainage she does not feel like it is deep in his chest.  Reviewed UC notes.   Past Medical History:  Diagnosis Date   Anxiety    Renal atrophy, right 02/07/2018   Seen on CT scan October 2019    Past Surgical History:  Procedure Laterality Date   TONSILECTOMY, ADENOIDECTOMY, BILATERAL MYRINGOTOMY AND TUBES  1992   TONSILLECTOMY  1992    Family History  Problem Relation Age of Onset   Cancer Mother        breast   Hypertension Father    Hyperlipidemia Father    Cancer Maternal Grandmother    Stroke Other        grandmother   Cancer Other        grandfather/bladder ca    Social History   Socioeconomic History   Marital status: Married    Spouse name: Not on file   Number of children: 1   Years of education: Not on file   Highest education level: Not on file  Occupational History   Occupation:  [employer]    Employer: [employer]  Tobacco Use   Smoking status: Former    Packs/day: 1.00    Years: 25.00    Pack years: 25.00    Types: Cigarettes    Quit date: 03/11/2021    Years since quitting: 0.1    Passive exposure: Never   Smokeless tobacco: Never  Vaping Use   Vaping Use: Never used  Substance and Sexual Activity   Alcohol use: No   Drug use: Not Currently    Comment: Patient reports that he stopped 3 months.   Sexual activity: Never    Comment: .  Other Topics Concern   Not on file  Social History Narrative   Daughter is Jaamal Farooqui and she is also a patient here. Separated from his wife.    Social Determinants of Health   Financial Resource Strain: Not on file  Food Insecurity: Not on file  Transportation Needs: Not on file  Physical Activity: Not on file  Stress: Not on file  Social Connections: Not on file  Intimate Partner Violence: Not on file    Outpatient Medications Prior to Visit  Medication Sig Dispense Refill   amoxicillin-clavulanate (AUGMENTIN) 875-125 MG tablet Take 1 tablet by mouth every 12 (twelve) hours. 14 tablet 0   atorvastatin (LIPITOR) 20 MG tablet Take 1 tablet (20 mg total) by mouth at bedtime. (Patient not taking: Reported on 04/24/2021) 90 tablet 3   hydrOXYzine (ATARAX/VISTARIL) 25 MG tablet Take 1 tablet (25 mg total) by mouth every 6 (six) hours as needed for itching. (Patient not taking: Reported on 04/24/2021) 20 tablet 0   mupirocin ointment (BACTROBAN) 2 % Apply 1 application topically 2 (two) times daily. (Patient not taking: Reported on 04/24/2021) 22 g 0   ofloxacin (OCUFLOX) 0.3 % ophthalmic solution Place 2 drops into the right eye 4 (four) times daily. (Patient not taking: Reported on 04/24/2021) 5 mL 0   pantoprazole (PROTONIX) 40 MG tablet Take 1 tablet (40 mg total) by mouth daily. (Patient not taking: Reported on 04/24/2021) 90 tablet 3   No facility-administered medications prior to visit.    No  Known Allergies  Review of Systems     Objective:    Physical Exam Constitutional:      Appearance: He is well-developed.  HENT:     Head: Normocephalic and atraumatic.     Right Ear: External ear normal.     Left Ear: External ear normal.     Nose: Nose normal.  Eyes:     Conjunctiva/sclera: Conjunctivae normal.     Pupils: Pupils are equal, round, and reactive to light.  Neck:     Thyroid: No thyromegaly.  Cardiovascular:     Rate and Rhythm: Normal rate.     Heart sounds: Normal heart sounds.  Pulmonary:     Effort: Pulmonary effort is normal.     Breath sounds: Normal breath sounds.  Musculoskeletal:     Cervical back: Neck supple.  Lymphadenopathy:     Cervical: No cervical adenopathy.  Skin:    General: Skin is warm and dry.  Neurological:     Mental Status: He is alert and oriented to person, place, and time.    BP (!) 130/94 (BP Location: Left Arm)    Pulse (!) 110    Resp 16    Ht 6' (1.829 m)    Wt 185 lb (83.9 kg)    SpO2 98%    BMI 25.09 kg/m  Wt Readings from Last 3 Encounters:  05/14/21 185 lb (83.9 kg)  04/24/21 190 lb (86.2 kg)  11/14/19 198 lb (89.8 kg)    Health Maintenance Due  Topic Date Due   HIV Screening  Never done   Hepatitis C Screening  Never done   COLONOSCOPY (Pts 45-24yrs Insurance coverage will need to be confirmed)  Never done    There are no preventive care reminders to display for this patient.   Lab Results  Component Value Date   TSH 1.231 04/04/2015   Lab Results  Component Value Date   WBC 10.3 11/15/2019   HGB 15.2 11/15/2019   HCT 44.3 11/15/2019   MCV 89.7 11/15/2019   PLT 255 11/15/2019   Lab Results  Component Value Date   NA 138 11/15/2019   K 4.3 11/15/2019   CO2 29 11/15/2019   GLUCOSE 84 11/15/2019   BUN 16 11/15/2019   CREATININE 0.87 11/15/2019   BILITOT 0.5 11/15/2019   ALKPHOS 95 09/19/2007   AST 22 11/15/2019   ALT 29 11/15/2019   PROT 7.0 11/15/2019   ALBUMIN 4.8 09/19/2007   CALCIUM  9.5 11/15/2019  Lab Results  Component Value Date   CHOL 283 (H) 11/15/2019   Lab Results  Component Value Date   HDL 44 11/15/2019   Lab Results  Component Value Date   LDLCALC 184 (H) 11/15/2019   Lab Results  Component Value Date   TRIG 326 (H) 11/15/2019   Lab Results  Component Value Date   CHOLHDL 6.4 (H) 11/15/2019   No results found for: HGBA1C     Assessment & Plan:   Problem List Items Addressed This Visit       Other   Familial combined hyperlipidemia    Did encourage him to restart his statin and come back in about a month so we can do a physical and blood work.      Relevant Medications   atorvastatin (LIPITOR) 20 MG tablet   Other Visit Diagnoses     Colon cancer screening    -  Primary   Relevant Orders   Ambulatory referral to Gastroenterology   Acute non-recurrent sinusitis, unspecified location       Relevant Medications   doxycycline (VIBRA-TABS) 100 MG tablet   HYDROcodone bit-homatropine (HYCODAN) 5-1.5 MG/5ML syrup   Acute cough       Relevant Medications   doxycycline (VIBRA-TABS) 100 MG tablet   HYDROcodone bit-homatropine (HYCODAN) 5-1.5 MG/5ML syrup   Other Relevant Orders   DG Chest 2 View   Weight loss          Sinusitis-we will treat with doxycycline.  He did feel better after the Augmentin but says it did upset his stomach.  Is difficult to say if he has new illness or if its recurrence of the previous.  Cough-I think right now it is mostly from postnasal drip at the same time he has had a cough for 6 to 7 weeks so if it does not get better in the next 1 to 2 weeks then he will need a chest x-ray. He has also hd about a 15 lb weight loss since I saw him a year ago. Has lost about 5 lbs in the month.    Meds ordered this encounter  Medications   doxycycline (VIBRA-TABS) 100 MG tablet    Sig: Take 1 tablet (100 mg total) by mouth 2 (two) times daily.    Dispense:  14 tablet    Refill:  0   atorvastatin (LIPITOR) 20 MG  tablet    Sig: Take 1 tablet (20 mg total) by mouth at bedtime.    Dispense:  90 tablet    Refill:  3   HYDROcodone bit-homatropine (HYCODAN) 5-1.5 MG/5ML syrup    Sig: Take 5-10 mLs by mouth at bedtime as needed for cough.    Dispense:  60 mL    Refill:  0     Beatrice Lecher, MD

## 2021-05-14 NOTE — Assessment & Plan Note (Signed)
Did encourage him to restart his statin and come back in about a month so we can do a physical and blood work.

## 2022-03-24 ENCOUNTER — Ambulatory Visit (INDEPENDENT_AMBULATORY_CARE_PROVIDER_SITE_OTHER): Payer: BC Managed Care – PPO | Admitting: Family Medicine

## 2022-03-24 ENCOUNTER — Encounter: Payer: Self-pay | Admitting: Family Medicine

## 2022-03-24 VITALS — BP 127/84 | HR 91 | Ht 72.0 in | Wt 189.0 lb

## 2022-03-24 DIAGNOSIS — Z1211 Encounter for screening for malignant neoplasm of colon: Secondary | ICD-10-CM | POA: Diagnosis not present

## 2022-03-24 DIAGNOSIS — Z23 Encounter for immunization: Secondary | ICD-10-CM

## 2022-03-24 DIAGNOSIS — Z125 Encounter for screening for malignant neoplasm of prostate: Secondary | ICD-10-CM | POA: Diagnosis not present

## 2022-03-24 DIAGNOSIS — Z0000 Encounter for general adult medical examination without abnormal findings: Secondary | ICD-10-CM

## 2022-03-24 DIAGNOSIS — R351 Nocturia: Secondary | ICD-10-CM

## 2022-03-24 NOTE — Progress Notes (Signed)
Complete physical exam  Patient: Anthony Morgan   DOB: 1970/07/20   51 y.o. Male  MRN: [id number]  Subjective:    Chief Complaint  Patient presents with   Annual Exam    Anthony Morgan is a 51 y.o. male who presents today for a complete physical exam. He reports consuming a general diet. The patient has a physically strenuous job, but has no regular exercise apart from work.  He generally feels well. He reports sleeping fairly well. He does have additional problems to discuss today.  Often times gets up 3-4 times at night to urinate.  Has been under a lot of stress recently.  His wife is in liver failure and they are trying to get evaluated to get on the transplant list at [facility].   Most recent fall risk assessment:    03/24/2022    8:52 AM  [place] in the past year? 0  Number falls in past yr: 0  Injury with Fall? 0  Risk for fall due to : No Fall Risks  Follow up Falls evaluation completed     Most recent depression screenings:    03/24/2022    8:52 AM 05/14/2021    1:55 PM  PHQ 2/9 Scores  PHQ - 2 Score 0 0        Patient Care Team: [name], MD as PCP - General   Outpatient Medications Prior to Visit  Medication Sig   atorvastatin (LIPITOR) 20 MG tablet Take 1 tablet (20 mg total) by mouth at bedtime.   [DISCONTINUED] doxycycline (VIBRA-TABS) 100 MG tablet Take 1 tablet (100 mg total) by mouth 2 (two) times daily.   [DISCONTINUED] HYDROcodone bit-homatropine (HYCODAN) 5-1.5 MG/5ML syrup Take 5-10 mLs by mouth at bedtime as needed for cough.   No facility-administered medications prior to visit.    ROS        Objective:     BP 127/84 (BP Location: Left Arm, Patient Position: Sitting, Cuff Size: Large)   Pulse 91   Ht 6' (1.829 m)   Wt 189 lb (85.7 kg)   SpO2 99%   BMI 25.63 kg/m    Physical Exam Constitutional:      Appearance: He is well-developed.  HENT:     Head: Normocephalic and atraumatic.     Right Ear: Tympanic  membrane, ear canal and external ear normal.     Left Ear: Tympanic membrane, ear canal and external ear normal.     Nose: Nose normal.     Mouth/Throat:     Pharynx: Oropharynx is clear.  Eyes:     Conjunctiva/sclera: Conjunctivae normal.     Pupils: Pupils are equal, round, and reactive to light.  Neck:     Thyroid: No thyromegaly.  Cardiovascular:     Rate and Rhythm: Normal rate and regular rhythm.     Heart sounds: Normal heart sounds.  Pulmonary:     Effort: Pulmonary effort is normal.     Breath sounds: Normal breath sounds.  Abdominal:     General: Bowel sounds are normal. There is no distension.     Palpations: Abdomen is soft. There is no mass.     Tenderness: There is no abdominal tenderness. There is no guarding or rebound.  Musculoskeletal:        General: Normal range of motion.     Cervical back: Normal range of motion and neck supple. No tenderness.  Lymphadenopathy:     Cervical: No  cervical adenopathy.  Skin:    General: Skin is warm and dry.  Neurological:     Mental Status: He is alert and oriented to person, place, and time.     Deep Tendon Reflexes: Reflexes are normal and symmetric.  Psychiatric:        Behavior: Behavior normal.        Thought Content: Thought content normal.        Judgment: Judgment normal.      No results found for any visits on 03/24/22.     Assessment & Plan:    Routine Health Maintenance and Physical Exam  Immunization History  Administered Date(s) Administered   Influenza Split 03/12/2011, 12/24/2011   Influenza, Seasonal, Injecte, Preservative Fre 01/11/2013   Td 04/13/2003   Tdap 06/28/2013   Zoster Recombinat (Shingrix) 03/24/2022    Health Maintenance  Topic Date Due   COLONOSCOPY (Pts 45-44yr Insurance coverage will need to be confirmed)  Never done   COVID-19 Vaccine (1) 04/09/2022 (Originally 09/18/1975)   INFLUENZA VACCINE  07/11/2022 (Originally 11/10/2021)   Lung Cancer Screening  03/25/2023 (Originally  09/17/2020)   Hepatitis C Screening  03/25/2023 (Originally 09/17/1988)   HIV Screening  03/25/2023 (Originally 09/17/1985)   Zoster Vaccines- Shingrix (2 of 2) 05/19/2022   DTaP/Tdap/Td (3 - Td or Tdap) 06/29/2023   Pneumococcal Vaccine [id number] old  Aged Out   HPV VACCINES  Aged Out    Discussed health benefits of physical activity, and encouraged him to engage in regular exercise appropriate for his age and condition.  Problem List Items Addressed This Visit       Other   Nocturia    We discussed possible causes of nocturia.  He is already tried limiting fluids right before bedtime.  He does work third shift.  We also discussed decreasing caffeine intake.  Also the possibility that it could be from an enlarged prostate gland.  He wants to work on cutting back on the caffeine first to see if he notices improvement.  I am happy to workup further at his convenience.      Other Visit Diagnoses     Routine general medical examination at a health care facility    -  Primary   Relevant Orders   CBC   Lipid Panel w/reflex Direct LDL   COMPLETE METABOLIC PANEL WITH GFR   PSA   Screening for prostate cancer       Relevant Orders   PSA   Screen for colon cancer       Relevant Orders   Ambulatory referral to Gastroenterology   Need for shingles vaccine       Relevant Orders   Zoster Recombinant (Shingrix ) (Completed)       Keep up a regular exercise program and make sure you are eating a healthy diet Try to eat 4 servings of dairy a day, or if you are lactose intolerant take a calcium with vitamin D daily.  Your vaccines are up to date.  Referral for colonoscopy placed. Shingles vaccine given today.  Follow-up in 2 to 6 months for second 1. Any labs ordered.  Return in about 1 year (around 03/25/2023).     CBeatrice Lecher MD

## 2022-03-24 NOTE — Assessment & Plan Note (Signed)
We discussed possible causes of nocturia.  He is already tried limiting fluids right before bedtime.  He does work third shift.  We also discussed decreasing caffeine intake.  Also the possibility that it could be from an enlarged prostate gland.  He wants to work on cutting back on the caffeine first to see if he notices improvement.  I am happy to workup further at his convenience.

## 2022-03-25 LAB — CBC
HCT: 44.4 % (ref 38.5–50.0)
Hemoglobin: 15.4 g/dL (ref 13.2–17.1)
MCH: 30.8 pg (ref 27.0–33.0)
MCHC: 34.7 g/dL (ref 32.0–36.0)
MCV: 88.8 fL (ref 80.0–100.0)
MPV: 10.2 fL (ref 7.5–12.5)
Platelets: 271 10*3/uL (ref 140–400)
RBC: 5 10*6/uL (ref 4.20–5.80)
RDW: 12.4 % (ref 11.0–15.0)
WBC: 9.1 10*3/uL (ref 3.8–10.8)

## 2022-03-25 LAB — COMPLETE METABOLIC PANEL WITH GFR
AG Ratio: 2 (calc) (ref 1.0–2.5)
ALT: 29 U/L (ref 9–46)
AST: 24 U/L (ref 10–35)
Albumin: 5.1 g/dL (ref 3.6–5.1)
Alkaline phosphatase (APISO): 79 U/L (ref 35–144)
BUN: 19 mg/dL (ref 7–25)
CO2: 29 mmol/L (ref 20–32)
Calcium: 10 mg/dL (ref 8.6–10.3)
Chloride: 100 mmol/L (ref 98–110)
Creat: 0.85 mg/dL (ref 0.70–1.30)
Globulin: 2.5 g/dL (calc) (ref 1.9–3.7)
Glucose, Bld: 86 mg/dL (ref 65–99)
Potassium: 4.4 mmol/L (ref 3.5–5.3)
Sodium: 138 mmol/L (ref 135–146)
Total Bilirubin: 0.5 mg/dL (ref 0.2–1.2)
Total Protein: 7.6 g/dL (ref 6.1–8.1)
eGFR: 105 mL/min/{1.73_m2} (ref >=60)

## 2022-03-25 LAB — LIPID PANEL W/REFLEX DIRECT LDL
Cholesterol: 272 mg/dL — ABNORMAL HIGH (ref <200)
HDL: 49 mg/dL (ref >=40)
LDL Cholesterol (Calc): 171 mg/dL (calc) — ABNORMAL HIGH
Non-HDL Cholesterol (Calc): 223 mg/dL (calc) — ABNORMAL HIGH (ref <130)
Total CHOL/HDL Ratio: 5.6 (calc) — ABNORMAL HIGH (ref <5.0)
Triglycerides: 309 mg/dL — ABNORMAL HIGH (ref <150)

## 2022-03-25 LAB — PSA: PSA: 0.28 ng/mL (ref <=4.00)

## 2022-03-25 NOTE — Progress Notes (Signed)
Hi Anthony Morgan, total cholesterol and triglycerides are still elevated.  Continue to work on healthy diet and regular exercise.  Blood count is normal.  State test and metabolic panel are normal as well.  The 10-year ASCVD risk score (Arnett DK, et al., 2019) is: 5.8%   Values used to calculate the score:     Age: 51 years     Sex: Male     Is Non-Hispanic African American: No     Diabetic: No     Tobacco smoker: No     Systolic Blood Pressure: 158 mmHg     Is BP treated: No     HDL Cholesterol: 49 mg/dL     Total Cholesterol: 272 mg/dL

## 2022-04-14 ENCOUNTER — Other Ambulatory Visit: Payer: Self-pay | Admitting: *Deleted

## 2022-04-14 DIAGNOSIS — K219 Gastro-esophageal reflux disease without esophagitis: Secondary | ICD-10-CM

## 2022-04-14 MED ORDER — PANTOPRAZOLE SODIUM 40 MG PO TBEC: 40.0000 mg | DELAYED_RELEASE_TABLET | Freq: Every day | ORAL | 3 refills | Status: DC

## 2022-05-28 ENCOUNTER — Ambulatory Visit (INDEPENDENT_AMBULATORY_CARE_PROVIDER_SITE_OTHER): Payer: BC Managed Care – PPO | Admitting: Family Medicine

## 2022-05-28 VITALS — Temp 98.1°F

## 2022-05-28 DIAGNOSIS — D229 Melanocytic nevi, unspecified: Secondary | ICD-10-CM | POA: Diagnosis not present

## 2022-05-28 DIAGNOSIS — Z23 Encounter for immunization: Secondary | ICD-10-CM | POA: Diagnosis not present

## 2022-05-28 NOTE — Progress Notes (Signed)
Agree with documentation as above.   Beatrice Lecher, MD  Orders Placed This Encounter  Procedures   Zoster Recombinant (Shingrix )   Ambulatory referral to Dermatology    Referral Priority:   Routine    Referral Type:   Consultation    Referral Reason:   Specialty Services Required    Requested Specialty:   Dermatology    Number of Visits Requested:   1

## 2022-05-28 NOTE — Progress Notes (Signed)
Pt is here for 2nd Shingrix vaccine. Pt tolerated vaccine well.    Pt would like a derm referral for a mole that is on the R side just above his temple that has been there for about 1 year.

## 2022-06-24 ENCOUNTER — Encounter: Payer: Self-pay | Admitting: Dermatology

## 2022-06-24 ENCOUNTER — Ambulatory Visit: Payer: BC Managed Care – PPO | Admitting: Dermatology

## 2022-06-24 ENCOUNTER — Ambulatory Visit (INDEPENDENT_AMBULATORY_CARE_PROVIDER_SITE_OTHER): Payer: BC Managed Care – PPO | Admitting: Dermatology

## 2022-06-24 ENCOUNTER — Other Ambulatory Visit: Payer: Self-pay

## 2022-06-24 DIAGNOSIS — D489 Neoplasm of uncertain behavior, unspecified: Secondary | ICD-10-CM | POA: Diagnosis not present

## 2022-06-24 DIAGNOSIS — L820 Inflamed seborrheic keratosis: Secondary | ICD-10-CM | POA: Diagnosis not present

## 2022-06-24 NOTE — Patient Instructions (Addendum)
Patient Handout: Wound Care for Skin Biopsy Site  Patient Handout: Wound Care for Skin Biopsy Site  Taking Care of Your Skin Biopsy Site  Proper care of the biopsy site is essential for promoting healing and minimizing scarring. This handout provides instructions on how to care for your biopsy site to ensure optimal recovery.  1. Cleaning the Wound:  Clean the biopsy site daily with gentle soap and water. Gently pat the area dry with a clean, soft towel. Avoid harsh scrubbing or rubbing the area, as this can irritate the skin and delay healing.  2. Applying Aquaphor and Bandage:  After cleaning the wound, apply a thin layer of Aquaphor ointment to the biopsy site. Cover the area with a sterile bandage to protect it from dirt, bacteria, and friction. Change the bandage daily or as needed if it becomes soiled or wet.  3. Continued Care for One Week:  Repeat the cleaning, Aquaphor application, and bandaging process daily for one week following the biopsy procedure. Keeping the wound clean and moist during this initial healing period will help prevent infection and promote optimal healing.  4. Massaging Aquaphor into the Area:  ---After one week, discontinue the use of bandages but continue to apply Aquaphor to the biopsy site. ----Gently massage the Aquaphor into the area using circular motions. ---Massaging the skin helps to promote circulation and prevent the formation of scar tissue.   Additional Tips:  Avoid exposing the biopsy site to direct sunlight during the healing process, as this can cause hyperpigmentation or worsen scarring. If you experience any signs of infection, such as increased redness, swelling, warmth, or drainage from the wound, contact your healthcare provider immediately. Follow any additional instructions provided by your healthcare provider for caring for the biopsy site and managing any discomfort. Conclusion:  Taking proper care of your skin biopsy site  is crucial for ensuring optimal healing and minimizing scarring. By following these instructions for cleaning, applying Aquaphor, and massaging the area, you can promote a smooth and successful recovery. If you have any questions or concerns about caring for your biopsy site, don't hesitate to contact your healthcare provider for guidance.  ce.

## 2022-06-24 NOTE — Progress Notes (Signed)
   New Patient Visit  Subjective  Anthony Morgan is a 52 y.o. male who presents for the following: Nevus (Atypical mole on right side of face. Was normal then got darker and bigger. BCC taken off side of nose in 2011. Parents hx of skin cancer).    Objective  Well appearing patient in no apparent distress; mood and affect are within normal limits.  All skin waist up examined.  A dermatoscope was used during the exam.  The following people were also present during my examination: , my medical assistant (male)   Right temple Excoriated papule      Assessment & Plan  Neoplasm of uncertain behavior Right temple  Skin / nail biopsy Type of biopsy: tangential   Informed consent: discussed and consent obtained   Timeout: patient name, date of birth, surgical site, and procedure verified   Procedure prep:  Patient was prepped and draped in usual sterile fashion Prep type:  Isopropyl alcohol Anesthesia: the lesion was anesthetized in a standard fashion   Anesthetic:  1% lidocaine w/ epinephrine 1-100,000 buffered w/ 8.4% NaHCO3 Instrument used: DermaBlade   Hemostasis achieved with: aluminum chloride   Outcome: patient tolerated procedure well   Post-procedure details: sterile dressing applied and wound care instructions given   Dressing type: petrolatum and bandage    Specimen 1 - Surgical pathology Differential Diagnosis: Rule out scc vs bcc  Check Margins: No  Lentigines - Scattered tan macules - Due to sun exposure - Benign-appearing, observe - Recommend daily broad spectrum sunscreen SPF 30+ to sun-exposed areas, reapply every 2 hours as needed. - Call for any changes  Seborrheic Keratoses - Stuck-on, waxy, tan-brown papules and/or plaques  - Benign-appearing - Discussed benign etiology and prognosis. - Observe - Call for any changes  Melanocytic Nevi - Tan-brown and/or pink-flesh-colored symmetric macules and papules - Benign appearing on exam today -  Observation - Call clinic for new or changing moles - Recommend daily use of broad spectrum spf 30+ sunscreen to sun-exposed areas.   Hemangiomas - Red papules - Discussed benign nature - Observe - Call for any changes  Actinic Damage - Chronic condition, secondary to cumulative UV/sun exposure - diffuse scaly erythematous macules with underlying dyspigmentation - Recommend daily broad spectrum sunscreen SPF 30+ to sun-exposed areas, reapply every 2 hours as needed.  - Staying in the shade or wearing long sleeves, sun glasses (UVA+UVB protection) and wide brim hats (4-inch brim around the entire circumference of the hat) are also recommended for sun protection.  - Call for new or changing lesions.  Skin cancer screening performed today.   No follow-ups on file.  Documentation: I have reviewed the above documentation for accuracy and completeness, and I agree with the above  [place], DO

## 2022-07-13 ENCOUNTER — Encounter: Payer: Self-pay | Admitting: Dermatology

## 2022-08-25 ENCOUNTER — Encounter: Payer: Self-pay | Admitting: Emergency Medicine

## 2022-08-25 ENCOUNTER — Ambulatory Visit
Admission: EM | Admit: 2022-08-25 | Discharge: 2022-08-25 | Disposition: A | Discharge status: Home / Self Care | Payer: BC Managed Care – PPO | Attending: Physician Assistant | Admitting: Physician Assistant

## 2022-08-25 DIAGNOSIS — R591 Generalized enlarged lymph nodes: Secondary | ICD-10-CM

## 2022-08-25 DIAGNOSIS — L03211 Cellulitis of face: Secondary | ICD-10-CM | POA: Diagnosis not present

## 2022-08-25 MED ORDER — DOXYCYCLINE HYCLATE 100 MG PO CAPS: 100.0000 mg | ORAL_CAPSULE | Freq: Two times a day (BID) | ORAL | 0 refills | Status: DC

## 2022-08-25 NOTE — ED Provider Notes (Signed)
[name]    CSN: [id number] Arrival date & time: 08/25/22  1905      History   Chief Complaint Chief Complaint  Patient presents with   Facial Swelling    HPI Anthony Morgan is a 52 y.o. male.   Patient complains of swelling to the left side of his neck.  Patient reports that he had an abscess come up on the left side of his face that has drained now he has swelling in his neck.  Patient denies any fever or chills.  Patient is concerned about infection because his wife has had a liver transplant.     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 Authorizing Provider  doxycycline (VIBRAMYCIN) 100 MG capsule Take 1 capsule (100 mg total) by mouth 2 (two) times daily. 08/25/22  Yes Cheron Schaumann K, PA-C  atorvastatin (LIPITOR) 20 MG tablet Take 1 tablet (20 mg total) by mouth at bedtime. Patient not taking: Reported on 08/25/2022 05/14/21   Agapito Games, MD  pantoprazole (PROTONIX) 40 MG tablet Take 1 tablet (40 mg total) by mouth daily. Patient not taking: Reported on 08/25/2022 04/14/22   Agapito Games, MD    Family History Family History  Problem Relation Age of Onset   Cancer Mother        breast   Hypertension Father    Hyperlipidemia Father    Cancer Maternal Grandmother    Stroke Other        grandmother   Cancer Other        grandfather/bladder ca    Social History Social History   Tobacco Use   Smoking status: Some Days    Packs/day: 0.25    Years: 25.00    Additional pack years: 0.00    Total pack years: 6.25    Types: Cigarettes    Passive exposure: Never   Smokeless tobacco: Never  Vaping Use   Vaping Use: Never used  Substance Use Topics   Alcohol use: No   Drug use: Not Currently     Allergies   Patient has no known allergies.   Review of Systems Review of Systems  All other systems reviewed and are negative.    Physical Exam Triage Vital Signs ED Triage Vitals  Enc Vitals Group     BP 08/25/22 1918 (!) 128/91     Pulse Rate 08/25/22 1918 88     Resp 08/25/22 1918 14  Temp 08/25/22 1918 98.5 F (36.9 C)     Temp Source 08/25/22 1918 Oral     SpO2 08/25/22 1918 98 %     Weight 08/25/22 1920 190 lb (86.2 kg)     Height 08/25/22 1920 6' (1.829 m)     Head Circumference --      Peak Flow --      Pain Score 08/25/22 1920 0     Pain Loc --      Pain Edu? --      Excl. in GC? --    No data found.  Updated Vital Signs BP (!) 128/91 (BP Location: Left Arm)   Pulse 88   Temp 98.5 F (36.9 C) (Oral)   Resp 14   Ht 6' (1.829 m)   Wt 86.2 kg   SpO2 98%   BMI 25.77 kg/m   Visual Acuity Right Eye Distance:   Left Eye Distance:   Bilateral Distance:    Right Eye Near:   Left Eye Near:    Bilateral Near:     Physical Exam Vitals and nursing note reviewed.  Constitutional:      Appearance: He is well-developed.  HENT:     Head: Normocephalic.  Neck:      Comments: 1.5 cm area of lymphadenopathy left neck 3 mm scabbed area left face. Cardiovascular:     Rate and Rhythm: Normal rate.  Pulmonary:     Effort: Pulmonary effort is normal.  Abdominal:     General: There is no distension.  Musculoskeletal:        General: Normal range of motion.  Skin:    General: Skin is warm.  Neurological:     General: No focal deficit present.     Mental Status: He is alert and oriented to person, place, and time.      UC Treatments / Results  Labs (all labs ordered are listed, but only abnormal results are displayed) Labs Reviewed - No data to display  EKG   Radiology No results found.  Procedures Procedures (including critical care time)  Medications Ordered in UC Medications - No data to display  Initial Impression / Assessment and Plan / UC Course  I have reviewed the triage vital signs and the nursing notes.  Pertinent labs & imaging results that were available during my care of the patient were reviewed by me and considered in my medical decision making (see chart for details).     MDM: Patient counseled on lymphadenopathy and skin infection patient is given a prescription for doxycycline.  He is advised to follow-up with his primary care physician if swelling persist after finishing antibiotic. Final Clinical Impressions(s) / UC Diagnoses   Final diagnoses:  Facial cellulitis  Lymphadenopathy     Discharge Instructions      Return if any problems.     ED Prescriptions     Medication Sig Dispense Auth. Provider   doxycycline (VIBRAMYCIN) 100 MG capsule Take 1 capsule (100 mg total) by mouth 2 (two) times daily. 20 capsule Elson Areas, [place]      PDMP not reviewed this encounter. An After Visit Summary was printed and given to the patient.       Elson Areas, [place] 08/25/22 1956

## 2022-08-25 NOTE — ED Triage Notes (Signed)
Pt noted an  itchy area on the left side of face last Thursday Scratched at it - clear drainage  Pt states he now has a swollen gland on the left below ear Denies feer or chills

## 2022-08-25 NOTE — Discharge Instructions (Signed)
Return if any problems.

## 2022-08-27 ENCOUNTER — Telehealth: Payer: Self-pay | Admitting: Family Medicine

## 2022-08-27 ENCOUNTER — Telehealth: Payer: Self-pay

## 2022-08-27 MED ORDER — HYDROXYZINE HCL 25 MG PO TABS: 25.0000 mg | ORAL_TABLET | Freq: Four times a day (QID) | ORAL | 0 refills | Status: AC | PRN

## 2022-08-27 NOTE — Telephone Encounter (Signed)
Patient request Hydroxyzine 25 mg be sent to his pharmacy for itching secondary to facial cellulitis.  Hydroxyzine 25 mg every 6 hours sent to patient's pharmacy per request.

## 2022-09-02 ENCOUNTER — Ambulatory Visit (INDEPENDENT_AMBULATORY_CARE_PROVIDER_SITE_OTHER): Payer: BC Managed Care – PPO | Admitting: Family Medicine

## 2022-09-02 VITALS — BP 121/72 | HR 89 | Ht 72.0 in | Wt 194.0 lb

## 2022-09-02 DIAGNOSIS — R351 Nocturia: Secondary | ICD-10-CM

## 2022-09-02 DIAGNOSIS — L03211 Cellulitis of face: Secondary | ICD-10-CM | POA: Diagnosis not present

## 2022-09-02 LAB — POCT URINALYSIS DIP (CLINITEK)
Bilirubin, UA: NEGATIVE
Blood, UA: NEGATIVE
Glucose, UA: NEGATIVE mg/dL
Ketones, POC UA: NEGATIVE mg/dL
Leukocytes, UA: NEGATIVE
Nitrite, UA: NEGATIVE
POC PROTEIN,UA: NEGATIVE
Spec Grav, UA: 1.02 (ref 1.010–1.025)
Urobilinogen, UA: 0.2 E.U./dL
pH, UA: 8.5 — AB (ref 5.0–8.0)

## 2022-09-02 MED ORDER — SULFAMETHOXAZOLE-TRIMETHOPRIM 800-160 MG PO TABS: 1.0000 | ORAL_TABLET | Freq: Two times a day (BID) | ORAL | 0 refills | Status: DC

## 2022-09-02 MED ORDER — TAMSULOSIN HCL 0.4 MG PO CAPS: 0.4000 mg | ORAL_CAPSULE | Freq: Every day | ORAL | 1 refills | Status: DC

## 2022-09-02 NOTE — Progress Notes (Signed)
Acute Office Visit  Subjective:     Patient ID: Anthony Morgan, male    DOB: 12/21/1970, 52 y.o.   MRN: [id number]  Chief Complaint  Patient presents with   Follow-up    HPI Patient is in today for   Pt stated that he was seen by UC on 5/15 for the area on the L side of his face. He was given Doxy 100 mg BID x 10 days.  He has taken about 8 days of the medication so far.  He says initially it had drained.  And the lymph node was swollen on the left side of his neck.   He states that he was shaving when he noticed that the L side of his face was swollen and somewhat painful.  He says it still feels a little tender if he presses on the area and he is noticed in the mirror it still looks very swollen.  He said it is hard to compare exactly because he had a lot of beard growth when it first happened and he really did not shave it for few days until afterwards.  He is just concerned that it still looks very swollen a week out   Scheduled for colonoscopy  next Friday.  ROS      Objective:    BP 121/72   Pulse 89   Ht 6' (1.829 m)   Wt 194 lb (88 kg)   SpO2 97%   BMI 26.31 kg/m    Physical Exam Constitutional:      Appearance: He is well-developed.  HENT:     Head: Normocephalic and atraumatic.     Comments: He does have some significant swelling on the left side of the jaw.  No induration.  I can see where the lesion drained there is a little bit of erythema and some scale but it looks like it is well-healed I do not feel any palpable abscess underneath the skin.    Left Ear: Tympanic membrane and ear canal normal.     Ears:     Comments: Fair amt of cerumen.  Eyes:     Conjunctiva/sclera: Conjunctivae normal.  Cardiovascular:     Rate and Rhythm: Normal rate and regular rhythm.     Heart sounds: Normal heart sounds.  Pulmonary:     Effort: Pulmonary effort is normal.     Breath sounds: Normal breath sounds.  Skin:    General: Skin is warm and dry.     Comments: No  significant cervical lymphadenopathy.  Neurological:     Mental Status: He is alert and oriented to person, place, and time.  Psychiatric:        Behavior: Behavior normal.     Results for orders placed or performed in visit on 09/02/22  POCT URINALYSIS DIP (CLINITEK)  Result Value Ref Range   Color, UA yellow yellow   Clarity, UA clear clear   Glucose, UA negative negative mg/dL   Bilirubin, UA negative negative   Ketones, POC UA negative negative mg/dL   Spec Grav, UA 0.865 7.846 - 1.025   Blood, UA negative negative   pH, UA 8.5 (A) 5.0 - 8.0   POC PROTEIN,UA negative negative, trace   Urobilinogen, UA 0.2 0.2 or 1.0 E.U./dL   Nitrite, UA Negative Negative   Leukocytes, UA Negative Negative        Assessment & Plan:   Problem List Items Addressed This Visit       Other  Nocturia - Primary   Relevant Medications   tamsulosin (FLOMAX) 0.4 MG CAPS capsule   Other Relevant Orders   POCT URINALYSIS DIP (CLINITEK) (Completed)   Other Visit Diagnoses     Facial cellulitis       Relevant Medications   sulfamethoxazole-trimethoprim (BACTRIM DS) 800-160 MG tablet      Cellulitis-working to switch gears to Bactrim DS and he can discontinue the doxycycline starting tomorrow.  If the swelling does not improve significantly over this next treatment course then please let us know.  If develops worsening pain or fever then let us know sooner rather than later.  You think the cervical lymphadenopathy has improved.  Nocturia-most likely from his prostate.  Recent PSA was normal in December.  We did go ahead and do a urinalysis today and it was normal.  Then trial of Flomax for the next 90 days to see if it is helpful.  Meds ordered this encounter  Medications   sulfamethoxazole-trimethoprim (BACTRIM DS) 800-160 MG tablet    Sig: Take 1 tablet by mouth 2 (two) times daily.    Dispense:  14 tablet    Refill:  0   tamsulosin (FLOMAX) 0.4 MG CAPS capsule    Sig: Take 1 capsule  (0.4 mg total) by mouth daily.    Dispense:  90 capsule    Refill:  1    No follow-ups on file.  Nani Gasser, MD

## 2022-09-02 NOTE — Progress Notes (Signed)
Pt stated that he was seen by UC on 5/15 for the area on the L side of his face. He was given Doxy 100 mg BID x 10 days.   He states that he was shaving when he noticed that the L side of his face was swollen and somewhat painful

## 2022-09-10 DIAGNOSIS — D122 Benign neoplasm of ascending colon: Secondary | ICD-10-CM | POA: Diagnosis not present

## 2022-09-10 DIAGNOSIS — E785 Hyperlipidemia, unspecified: Secondary | ICD-10-CM | POA: Diagnosis not present

## 2022-09-10 DIAGNOSIS — Z1211 Encounter for screening for malignant neoplasm of colon: Secondary | ICD-10-CM | POA: Diagnosis not present

## 2022-09-10 LAB — HM COLONOSCOPY

## 2023-03-28 ENCOUNTER — Ambulatory Visit (INDEPENDENT_AMBULATORY_CARE_PROVIDER_SITE_OTHER): Payer: BC Managed Care – PPO | Admitting: Family Medicine

## 2023-03-28 ENCOUNTER — Encounter: Payer: Self-pay | Admitting: Family Medicine

## 2023-03-28 VITALS — BP 126/90 | HR 92 | Ht 72.0 in | Wt 200.2 lb

## 2023-03-28 DIAGNOSIS — Z0000 Encounter for general adult medical examination without abnormal findings: Secondary | ICD-10-CM

## 2023-03-28 DIAGNOSIS — Z125 Encounter for screening for malignant neoplasm of prostate: Secondary | ICD-10-CM

## 2023-03-28 NOTE — Progress Notes (Signed)
Complete physical exam  Patient: Anthony Morgan   DOB: [birth year]   52 y.o. Male  MRN: [id number]  Subjective:    Chief Complaint  Patient presents with   Annual Exam    Required for insurance for work.     Anthony Morgan is a 52 y.o. male who presents today for a complete physical exam. He reports consuming a general diet. The patient has a physically strenuous job, but has no regular exercise apart from work.  He generally feels well. He reports sleeping fairly well. He does not have additional problems to discuss today.    Most recent fall risk assessment:    03/28/2023    8:34 AM  Fall Risk   Falls in the past year? 0  Injury with Fall? 0  Risk for fall due to : No Fall Risks  Follow up Falls evaluation completed     Most recent depression screenings:    03/24/2022    8:52 AM 05/14/2021    1:55 PM  PHQ 2/9 Scores  PHQ - 2 Score 0 0         Patient Care Team: Agapito Games, MD as PCP - General   Outpatient Medications Prior to Visit  Medication Sig   sulfamethoxazole-trimethoprim (BACTRIM DS) 800-160 MG tablet Take 1 tablet by mouth 2 (two) times daily. (Patient not taking: Reported on 03/28/2023)   tamsulosin (FLOMAX) 0.4 MG CAPS capsule Take 1 capsule (0.4 mg total) by mouth daily. (Patient not taking: Reported on 03/28/2023)   No facility-administered medications prior to visit.    ROS        Objective:     BP (!) 126/90 (BP Location: Left Arm, Patient Position: Sitting, Cuff Size: Normal)   Pulse 92   Ht 6' (1.829 m)   Wt 200 lb 4 oz (90.8 kg)   SpO2 99%   BMI 27.16 kg/m     Physical Exam Constitutional:      Appearance: Normal appearance.  HENT:     Head: Normocephalic and atraumatic.     Right Ear: Tympanic membrane, ear canal and external ear normal.     Left Ear: Tympanic membrane, ear canal and external ear normal.     Nose: Nose normal.     Mouth/Throat:     Pharynx: Oropharynx is clear.  Eyes:     Extraocular Movements:  Extraocular movements intact.     Conjunctiva/sclera: Conjunctivae normal.     Pupils: Pupils are equal, round, and reactive to light.  Neck:     Thyroid: No thyromegaly.  Cardiovascular:     Rate and Rhythm: Normal rate and regular rhythm.  Pulmonary:     Effort: Pulmonary effort is normal.     Breath sounds: Normal breath sounds.  Abdominal:     General: Bowel sounds are normal.     Palpations: Abdomen is soft.     Tenderness: There is no abdominal tenderness.  Musculoskeletal:        General: No swelling.     Cervical back: Neck supple.  Skin:    General: Skin is warm and dry.  Neurological:     Mental Status: He is oriented to person, place, and time.  Psychiatric:        Mood and Affect: Mood normal.        Behavior: Behavior normal.      No results found for any visits on 03/28/23.      Assessment & Plan:    Routine  Health Maintenance and Physical Exam  Immunization History  Administered Date(s) Administered   Influenza Split 03/12/2011, 12/24/2011   Influenza, Seasonal, Injecte, Preservative Fre 01/11/2013   Td 04/13/2003   Tdap 06/28/2013   Zoster Recombinant(Shingrix) 03/24/2022, 05/28/2022    Health Maintenance  Topic Date Due   HIV Screening  Never done   Hepatitis C Screening  Never done   Pneumococcal Vaccine 15-2 Years old (1 of 2 - PCV) 04/12/2023 (Originally 09/17/1976)   COVID-19 Vaccine (1) 06/13/2023 (Originally 09[birth year]18/1975)   INFLUENZA VACCINE  07/11/2023 (Originally 11/11/2022)   DTaP/Tdap/Td (3 - Td or Tdap) 06/29/2023   Colonoscopy  09/09/2032   Zoster Vaccines- Shingrix  Completed   HPV VACCINES  Aged Out    Discussed health benefits of physical activity, and encouraged him to engage in regular exercise appropriate for his age and condition.  Problem List Items Addressed This Visit   None Visit Diagnoses       Wellness examination    -  Primary   Relevant Orders   Lipid panel   CMP14+EGFR   CBC w/Diff/Platelet   PSA     Screening  for malignant neoplasm of prostate       Relevant Orders   PSA       Keep up a regular exercise program and make sure you are eating a healthy diet Try to eat 4 servings of dairy a day, or if you are lactose intolerant take a calcium with vitamin D daily.  Your vaccines are up to date.   No follow-ups on file.     Nani Gasser, MD

## 2023-03-29 LAB — CBC WITH DIFFERENTIAL/PLATELET
Basophils Absolute: 0.1 10*3/uL (ref 0.0–0.2)
Basos: 1 %
EOS (ABSOLUTE): 0.4 10*3/uL (ref 0.0–0.4)
Eos: 4 %
Hematocrit: 48.6 % (ref 37.5–51.0)
Hemoglobin: 16.4 g/dL (ref 13.0–17.7)
Immature Grans (Abs): 0.1 10*3/uL (ref 0.0–0.1)
Immature Granulocytes: 1 %
Lymphocytes Absolute: 2.8 10*3/uL (ref 0.7–3.1)
Lymphs: 31 %
MCH: 30.5 pg (ref 26.6–33.0)
MCHC: 33.7 g/dL (ref 31.5–35.7)
MCV: 90 fL (ref 79–97)
Monocytes Absolute: 0.6 10*3/uL (ref 0.1–0.9)
Monocytes: 7 %
Neutrophils Absolute: 5.1 10*3/uL (ref 1.4–7.0)
Neutrophils: 56 %
Platelets: 232 10*3/uL (ref 150–450)
RBC: 5.38 x10E6/uL (ref 4.14–5.80)
RDW: 12.6 % (ref 11.6–15.4)
WBC: 9 10*3/uL (ref 3.4–10.8)

## 2023-03-29 LAB — CMP14+EGFR
ALT: 50 [IU]/L — ABNORMAL HIGH (ref 0–44)
AST: 27 [IU]/L (ref 0–40)
Albumin: 4.9 g/dL (ref 3.8–4.9)
Alkaline Phosphatase: 106 [IU]/L (ref 44–121)
BUN/Creatinine Ratio: 13 (ref 9–20)
BUN: 12 mg/dL (ref 6–24)
Bilirubin Total: 0.3 mg/dL (ref 0.0–1.2)
CO2: 24 mmol/L (ref 20–29)
Calcium: 9.5 mg/dL (ref 8.7–10.2)
Chloride: 99 mmol/L (ref 96–106)
Creatinine, Ser: 0.9 mg/dL (ref 0.76–1.27)
Globulin, Total: 2.1 g/dL (ref 1.5–4.5)
Glucose: 82 mg/dL (ref 70–99)
Potassium: 4.6 mmol/L (ref 3.5–5.2)
Sodium: 141 mmol/L (ref 134–144)
Total Protein: 7 g/dL (ref 6.0–8.5)
eGFR: 103 mL/min/{1.73_m2} (ref >59)

## 2023-03-29 LAB — LIPID PANEL
Chol/HDL Ratio: 7.3 {ratio} — ABNORMAL HIGH (ref 0.0–5.0)
Cholesterol, Total: 283 mg/dL — ABNORMAL HIGH (ref 100–199)
HDL: 39 mg/dL — ABNORMAL LOW (ref >39)
LDL Chol Calc (NIH): 150 mg/dL — ABNORMAL HIGH (ref 0–99)
Triglycerides: 492 mg/dL — ABNORMAL HIGH (ref 0–149)
VLDL Cholesterol Cal: 94 mg/dL — ABNORMAL HIGH (ref 5–40)

## 2023-03-29 LAB — PSA: Prostate Specific Ag, Serum: 0.3 ng/mL (ref 0.0–4.0)

## 2023-03-29 NOTE — Progress Notes (Signed)
[place], total cholesterol and LDL are elevated.  Triglycerides are also very high.  10-year risk of cardiovascular event such as heart attack or stroke is 16%.  Anything 10% or higher we strongly recommend a statin to lower that risk that would be a cholesterol medication that you would take at night.  Your ALT liver enzyme is mildly elevated similar to in the past it has bumped up a lot of times this comes from diet.  Just make sure avoiding any excess alcohol or Tylenol products and try to really work on improving your diet more fresh vegetables and less carbs and processed foods and deli meats things like that.  Blood count is normal.  Prostate test is normal.  The 10-year ASCVD risk score (Arnett DK, et al., 2019) is: 16.4%   Values used to calculate the score:     Age: 52 years     Sex: Male     Is Non-Hispanic African American: No     Diabetic: No     Tobacco smoker: Yes     Systolic Blood Pressure: 126 mmHg     Is BP treated: No     HDL Cholesterol: 39 mg/dL     Total Cholesterol: 283 mg/dL

## 2024-01-24 DIAGNOSIS — G40909 Epilepsy, unspecified, not intractable, without status epilepticus: Secondary | ICD-10-CM | POA: Diagnosis not present

## 2024-01-24 DIAGNOSIS — R55 Syncope and collapse: Secondary | ICD-10-CM | POA: Diagnosis not present

## 2024-01-24 DIAGNOSIS — R269 Unspecified abnormalities of gait and mobility: Secondary | ICD-10-CM | POA: Diagnosis not present

## 2024-01-24 DIAGNOSIS — K7030 Alcoholic cirrhosis of liver without ascites: Secondary | ICD-10-CM | POA: Diagnosis not present

## 2024-01-24 DIAGNOSIS — Z944 Liver transplant status: Secondary | ICD-10-CM | POA: Diagnosis not present

## 2024-01-24 DIAGNOSIS — E1100 Type 2 diabetes mellitus with hyperosmolarity without nonketotic hyperglycemic-hyperosmolar coma (NKHHC): Secondary | ICD-10-CM | POA: Diagnosis not present

## 2024-01-30 DIAGNOSIS — R55 Syncope and collapse: Secondary | ICD-10-CM | POA: Diagnosis not present

## 2024-02-01 DIAGNOSIS — I471 Supraventricular tachycardia, unspecified: Secondary | ICD-10-CM | POA: Diagnosis not present

## 2024-02-22 DIAGNOSIS — Z944 Liver transplant status: Secondary | ICD-10-CM | POA: Diagnosis not present

## 2024-02-22 DIAGNOSIS — I951 Orthostatic hypotension: Secondary | ICD-10-CM | POA: Diagnosis not present

## 2024-02-22 DIAGNOSIS — R55 Syncope and collapse: Secondary | ICD-10-CM | POA: Diagnosis not present

## 2024-02-22 DIAGNOSIS — K7030 Alcoholic cirrhosis of liver without ascites: Secondary | ICD-10-CM | POA: Diagnosis not present

## 2024-03-07 DIAGNOSIS — I471 Supraventricular tachycardia, unspecified: Secondary | ICD-10-CM | POA: Diagnosis not present

## 2024-03-28 ENCOUNTER — Encounter: Payer: Self-pay | Admitting: Family Medicine

## 2024-03-28 ENCOUNTER — Encounter: Admitting: Family Medicine

## 2024-03-28 VITALS — BP 130/82 | HR 98 | Ht 71.5 in | Wt 195.8 lb

## 2024-03-28 DIAGNOSIS — Z0000 Encounter for general adult medical examination without abnormal findings: Secondary | ICD-10-CM | POA: Diagnosis not present

## 2024-03-28 DIAGNOSIS — Z23 Encounter for immunization: Secondary | ICD-10-CM

## 2024-03-28 NOTE — Progress Notes (Signed)
 Complete physical exam  Patient: Anthony Morgan    DOB: 29-Mar-1971 53 y.o.   MRN: [id number]  Chief Complaint  Patient presents with   Annual Exam    Patient declines all vaccines with the exception of TDAP    Subjective:    Anthony Morgan is a 53 y.o. male who presents today for a complete physical exam. He reports consuming a general diet. The patient has a physically strenuous job, but has no regular exercise apart from work.  He generally feels well.  He does not have additional problems to discuss today.   Discussed the use of AI scribe software for clinical note transcription with the patient, who gave verbal consent to proceed.  History of Present Illness Anthony Morgan is a 53 year old male who presents for a routine follow-up and blood work.  Unintentional weight loss and dietary habits - Recent weight loss attributed to dietary changes - Diet includes energy drinks and occasional greasy or fried foods - Attempts to use olive oil when cooking at home - Acknowledges need to increase vegetable intake - Family history of hypercholesterolemia, particularly in his father  Musculoskeletal injury - Approximately one month ago, sustained injury to big toe after missing a step on patio - Discomfort primarily when walking without shoes - No significant swelling observed - Pain has decreased over time - No prior medical evaluation for this injury  Physical activity - Walks dog daily - No other regular exercise currently - Considering increasing activity level  Medication use and preferences - Not currently taking any medications, including tamsulosin  (discontinued) - Prefers to avoid medication, inspired by a friend who manages without medications at an advanced age  Cardiovascular symptoms and risk factors - No chest pain - No family history of heart disease   Most recent fall risk assessment:    03/28/2023    8:34 AM  Fall Risk   Falls in the past year? 0   Injury with Fall? 0   Risk for fall due to : No Fall Risks  Follow up Falls evaluation completed     Data saved with a previous flowsheet row definition     Most recent depression screenings:    03/28/2024    8:51 AM 03/28/2023    9:08 AM  PHQ 2/9 Scores  PHQ - 2 Score 0 0  PHQ- 9 Score 0 0      Data saved with a previous flowsheet row definition        Patient Care Team: Anthony Dorothyann BIRCH, MD as PCP - General   ROS    Objective:    BP 130/82   Pulse 98   Ht 5' 11.5 (1.816 m)   Wt 195 lb 12 oz (88.8 kg)   SpO2 98%   BMI 26.92 kg/m     Physical Exam Constitutional:      Appearance: Normal appearance.  HENT:     Head: Normocephalic and atraumatic.     Right Ear: Tympanic membrane, ear canal and external ear normal.     Left Ear: Tympanic membrane, ear canal and external ear normal.     Nose: Nose normal.     Mouth/Throat:     Pharynx: Oropharynx is clear.  Eyes:     Extraocular Movements: Extraocular movements intact.     Conjunctiva/sclera: Conjunctivae normal.     Pupils: Pupils are equal, round, and reactive to light.  Neck:     Thyroid: No thyromegaly.  Cardiovascular:  Rate and Rhythm: Normal rate and regular rhythm.  Pulmonary:     Effort: Pulmonary effort is normal.     Breath sounds: Normal breath sounds.  Abdominal:     General: Bowel sounds are normal.     Palpations: Abdomen is soft.     Tenderness: There is no abdominal tenderness.  Musculoskeletal:        General: No swelling.     Cervical back: Neck supple.  Skin:    General: Skin is warm and dry.  Neurological:     Mental Status: He is oriented to person, place, and time.  Psychiatric:        Mood and Affect: Mood normal.        Behavior: Behavior normal.       No results found for any visits on 03/28/24.       Assessment & Plan:    Routine Health Maintenance and Physical Exam Immunization History  Administered Date(s) Administered   Influenza Split  03/12/2011, 12/24/2011   Influenza, Seasonal, Injecte, Preservative Fre 01/11/2013   Td 04/13/2003   Tdap 06/28/2013, 03/28/2024   Zoster Recombinant(Shingrix ) 03/24/2022, 05/28/2022    Health Maintenance  Topic Date Due   HIV Screening  Never done   Hepatitis C Screening  Never done   COVID-19 Vaccine (1) 04/13/2024 (Originally 03/20/1971)   Influenza Vaccine  07/10/2024 (Originally 11/11/2023)   Pneumococcal Vaccine: 50+ Years (1 of 2 - PCV) 03/28/2025 (Originally 09/17/1989)   Hepatitis B Vaccines 19-59 Average Risk (1 of 3 - 19+ 3-dose series) 03/28/2025 (Originally 09/17/1989)   Colonoscopy  09/09/2032   DTaP/Tdap/Td (4 - Td or Tdap) 03/28/2034   Zoster Vaccines- Shingrix   Completed   HPV VACCINES  Aged Out   Meningococcal B Vaccine  Aged Out    Discussed health benefits of physical activity, and encouraged him to engage in regular exercise appropriate for his age and condition.  Problem List Items Addressed This Visit   None Visit Diagnoses       Wellness examination    -  Primary   Relevant Orders   PSA   CMP14+EGFR   Lipid Panel With LDL/HDL Ratio   CBC     Need for Tdap vaccination       Relevant Orders   Tdap vaccine greater than or equal to 7yo IM (Completed)       Assessment and Plan Assessment & Plan Hyperlipidemia Discussed lifestyle modifications. Noted weight loss and dietary habits. Family history of heart disease and cholesterol issues. Hesitant about medication, open to reviewing lab results. - Ordered blood work for cholesterol levels. - Encouraged weight loss and healthy eating. - Discussed potential need for medication based on results.  General Health Maintenance Tetanus vaccine due. Active lifestyle noted. Uses olive oil, encouraged more vegetables. - Administered tetanus vaccine. - Encouraged regular exercise and healthy diet.    Return in about 1 year (around 03/28/2025) for Wellness Exam.    Dorothyann Byars, MD [facility] at [facility]

## 2024-03-29 ENCOUNTER — Ambulatory Visit: Payer: Self-pay | Admitting: Family Medicine

## 2024-03-29 DIAGNOSIS — E782 Mixed hyperlipidemia: Secondary | ICD-10-CM

## 2024-03-29 LAB — CBC
Hematocrit: 47.8 % (ref 37.5–51.0)
Hemoglobin: 16 g/dL (ref 13.0–17.7)
MCH: 30.8 pg (ref 26.6–33.0)
MCHC: 33.5 g/dL (ref 31.5–35.7)
MCV: 92 fL (ref 79–97)
Platelets: 242 x10E3/uL (ref 150–450)
RBC: 5.2 x10E6/uL (ref 4.14–5.80)
RDW: 12.3 % (ref 11.6–15.4)
WBC: 9.3 x10E3/uL (ref 3.4–10.8)

## 2024-03-29 LAB — CMP14+EGFR
ALT: 33 IU/L (ref 0–44)
AST: 26 IU/L (ref 0–40)
Albumin: 4.8 g/dL (ref 3.8–4.9)
Alkaline Phosphatase: 93 IU/L (ref 47–123)
BUN/Creatinine Ratio: 13 (ref 9–20)
BUN: 13 mg/dL (ref 6–24)
Bilirubin Total: 0.5 mg/dL (ref 0.0–1.2)
CO2: 26 mmol/L (ref 20–29)
Calcium: 9.7 mg/dL (ref 8.7–10.2)
Chloride: 99 mmol/L (ref 96–106)
Creatinine, Ser: 0.98 mg/dL (ref 0.76–1.27)
Globulin, Total: 2.1 g/dL (ref 1.5–4.5)
Glucose: 92 mg/dL (ref 70–99)
Potassium: 5.6 mmol/L — ABNORMAL HIGH (ref 3.5–5.2)
Sodium: 138 mmol/L (ref 134–144)
Total Protein: 6.9 g/dL (ref 6.0–8.5)
eGFR: 92 mL/min/1.73 (ref >59)

## 2024-03-29 LAB — LIPID PANEL WITH LDL/HDL RATIO
Cholesterol, Total: 257 mg/dL — ABNORMAL HIGH (ref 100–199)
HDL: 50 mg/dL (ref >39)
LDL Chol Calc (NIH): 161 mg/dL — ABNORMAL HIGH (ref 0–99)
LDL/HDL Ratio: 3.2 ratio (ref 0.0–3.6)
Triglycerides: 247 mg/dL — ABNORMAL HIGH (ref 0–149)
VLDL Cholesterol Cal: 46 mg/dL — ABNORMAL HIGH (ref 5–40)

## 2024-03-29 LAB — PSA: Prostate Specific Ag, Serum: 0.3 ng/mL (ref 0.0–4.0)

## 2024-03-29 MED ORDER — ATORVASTATIN CALCIUM 20 MG PO TABS: 20.0000 mg | ORAL_TABLET | Freq: Every day | ORAL | 3 refills | Status: AC

## 2024-03-29 NOTE — Progress Notes (Signed)
 Hi Anthony Morgan, metabolic panel overall looks good the ALT liver enzyme that was slightly elevated a year ago is back down which is great.  Your total cholesterol and LDL are still elevated.  That your cardiovascular risk score is around 12% that means you have a 12% risk of developing a heart attack or stroke in the next 10 years.  Anything 10% or higher we highly recommend a statin to reduce your risk.  Please please consider taking a statin I will be happy to send a prescription over if you are okay with it.  Blood count and prostate test are normal.  The 10-year ASCVD risk score (Arnett DK, et al., 2019) is: 12.8%   Values used to calculate the score:     Age: 53 years     Clinically relevant sex: Male     Is Non-Hispanic African American: No     Diabetic: No     Tobacco smoker: Yes     Systolic Blood Pressure: 130 mmHg     Is BP treated: No     HDL Cholesterol: 50 mg/dL     Total Cholesterol: 257 mg/dL

## 2025-04-01 ENCOUNTER — Encounter: Admitting: Family Medicine
